# Patient Record
Sex: Male | Born: 1940
Health system: Southern US, Community
[De-identification: ages and names within clinical notes are randomized; demographics above are authoritative.]

## PROBLEM LIST (undated history)

## (undated) DIAGNOSIS — N400 Enlarged prostate without lower urinary tract symptoms: Secondary | ICD-10-CM

## (undated) HISTORY — DX: Benign prostatic hyperplasia without lower urinary tract symptoms: N40.0

---

## 2002-12-19 LAB — HM COLONOSCOPY

## 2008-04-05 ENCOUNTER — Encounter: Admission: RE | Admit: 2008-04-05 | Discharge: 2008-04-05 | Payer: Self-pay | Admitting: Family Medicine

## 2011-03-11 ENCOUNTER — Other Ambulatory Visit: Payer: Self-pay | Admitting: Family Medicine

## 2011-03-12 ENCOUNTER — Ambulatory Visit
Admission: RE | Admit: 2011-03-12 | Discharge: 2011-03-12 | Disposition: A | Discharge status: Home / Self Care | Payer: Medicare Other | Source: Ambulatory Visit | Attending: Family Medicine | Admitting: Family Medicine

## 2011-03-12 MED ORDER — IOHEXOL 300 MG/ML  SOLN
75.0000 mL | Freq: Once | INTRAMUSCULAR | Status: AC | PRN
  Administered 2011-03-12: 75 mL via INTRAVENOUS

## 2011-05-26 ENCOUNTER — Other Ambulatory Visit: Payer: Self-pay | Admitting: Family Medicine

## 2011-05-26 DIAGNOSIS — M898X2 Other specified disorders of bone, upper arm: Secondary | ICD-10-CM

## 2011-05-29 ENCOUNTER — Other Ambulatory Visit: Payer: Self-pay | Admitting: Family Medicine

## 2011-05-29 DIAGNOSIS — M898X2 Other specified disorders of bone, upper arm: Secondary | ICD-10-CM

## 2011-06-05 ENCOUNTER — Ambulatory Visit
Admission: RE | Admit: 2011-06-05 | Discharge: 2011-06-05 | Disposition: A | Discharge status: Home / Self Care | Payer: Medicare Other | Source: Ambulatory Visit | Attending: Family Medicine | Admitting: Family Medicine

## 2011-06-05 DIAGNOSIS — M898X2 Other specified disorders of bone, upper arm: Secondary | ICD-10-CM

## 2013-03-03 ENCOUNTER — Encounter: Payer: Self-pay | Admitting: Family Medicine

## 2013-03-03 ENCOUNTER — Ambulatory Visit (INDEPENDENT_AMBULATORY_CARE_PROVIDER_SITE_OTHER): Payer: Medicare Other | Admitting: Family Medicine

## 2013-03-03 VITALS — BP 120/82 | HR 68 | Temp 97.6°F | Resp 14 | Ht 74.5 in | Wt 239.0 lb

## 2013-03-03 DIAGNOSIS — Z23 Encounter for immunization: Secondary | ICD-10-CM

## 2013-03-03 DIAGNOSIS — L723 Sebaceous cyst: Secondary | ICD-10-CM

## 2013-03-03 DIAGNOSIS — B351 Tinea unguium: Secondary | ICD-10-CM

## 2013-03-03 MED ORDER — TERBINAFINE HCL 250 MG PO TABS: 250.0000 mg | ORAL_TABLET | Freq: Every day | ORAL | Status: DC

## 2013-03-03 NOTE — Progress Notes (Signed)
   Subjective:    Patient ID: Jose Owen, male    DOB: Mar 20, 1941, 72 y.o.   MRN: [id number]  HPI  Both great toenails have become thick and dystrophic over the last few months.  Now they have started to separate and lift from the underlying nail bed.  He requests evaluation.  He is also concerned about a cystic lesion on the posterior of his neck. No past medical history on file. No current outpatient prescriptions on file prior to visit.   No current facility-administered medications on file prior to visit.   No Known Allergies History   Social History  . Marital Status: Married    Spouse Name: N/A    Number of Children: N/A  . Years of Education: N/A   Occupational History  . Not on file.   Social History Main Topics  . Smoking status: Former Smoker    Types: Cigarettes  . Smokeless tobacco: Not on file  . Alcohol Use: No  . Drug Use: No  . Sexual Activity: Not on file   Other Topics Concern  . Not on file   Social History Narrative  . No narrative on file     Review of Systems  All other systems reviewed and are negative.       Objective:   Physical Exam  Vitals reviewed. Cardiovascular: Normal rate and regular rhythm.   No murmur heard. Pulmonary/Chest: Effort normal and breath sounds normal. No respiratory distress. He has no wheezes.  sebeceous cyst on back of his neck 2 cm in size.   Onychomycosis of both great toenails.         Assessment & Plan:  1. Onychomycosis Lamisil 250 mg poqday for 90 days  Recheck cmp monthly on meds to monitor for liver toxicity.  2. Sebaceous cyst Offered surgical excision but patient declined  Patient received prevnar 13 and flu shot.

## 2013-03-04 NOTE — Addendum Note (Signed)
Addended by: Legrand Rams B on: 03/04/2013 08:13 AM   Modules accepted: Orders

## 2013-03-22 ENCOUNTER — Ambulatory Visit (INDEPENDENT_AMBULATORY_CARE_PROVIDER_SITE_OTHER): Payer: Medicare Other | Admitting: Family Medicine

## 2013-03-22 ENCOUNTER — Encounter: Payer: Self-pay | Admitting: Family Medicine

## 2013-03-22 VITALS — BP 136/70 | HR 72 | Temp 98.7°F | Resp 16 | Wt 250.0 lb

## 2013-03-22 DIAGNOSIS — B029 Zoster without complications: Secondary | ICD-10-CM

## 2013-03-22 MED ORDER — VALACYCLOVIR HCL 1 G PO TABS: 1000.0000 mg | ORAL_TABLET | Freq: Three times a day (TID) | ORAL | Status: DC

## 2013-03-22 MED ORDER — HYDROCODONE-ACETAMINOPHEN 5-325 MG PO TABS: 1.0000 | ORAL_TABLET | Freq: Four times a day (QID) | ORAL | Status: DC | PRN

## 2013-03-22 NOTE — Progress Notes (Signed)
   Subjective:    Patient ID: Jose Owen, male    DOB: [birth year], 72 y.o.   MRN: [id number]  HPI Patient developed low back pain approximately 3 days ago. One day ago he developed a blistering erythematous rash that began at the level of L1 in the center of his back and radiated around his back and dermatomal pattern. It has a classic appearance of shingles. The area is extremely tender and sensitive.  He denies any fevers headaches or signs of systemic illness. No past medical history on file. Current Outpatient Prescriptions on File Prior to Visit  Medication Sig Dispense Refill  . glucosamine-chondroitin 500-400 MG tablet Take 1 tablet by mouth 3 (three) times daily.      Marland Kitchen terbinafine (LAMISIL) 250 MG tablet Take 1 tablet (250 mg total) by mouth daily.  30 tablet  2   No current facility-administered medications on file prior to visit.   No Known Allergies History   Social History  . Marital Status: Married    Spouse Name: N/A    Number of Children: N/A  . Years of Education: N/A   Occupational History  . Not on file.   Social History Main Topics  . Smoking status: Former Smoker    Types: Cigarettes  . Smokeless tobacco: Not on file  . Alcohol Use: No  . Drug Use: No  . Sexual Activity: Not on file   Other Topics Concern  . Not on file   Social History Narrative  . No narrative on file      Review of Systems  All other systems reviewed and are negative.       Objective:   Physical Exam  Vitals reviewed. Cardiovascular: Normal rate, regular rhythm and normal heart sounds.  Exam reveals no gallop.   No murmur heard. Pulmonary/Chest: Effort normal and breath sounds normal. No respiratory distress. He has no wheezes. He has no rales.  Abdominal: Soft. Bowel sounds are normal.  Skin: Rash noted. There is erythema.   shingles-like rash in a dermatomal pattern beginning around the level of L1 in the center of the back. The area is approximately 12 centimeters  by 4 cm in size.        Assessment & Plan:  1. Shingles I discussed the natural history of shingles. I explained that the rash is not contagious to people that had chickenpox. However I explained that it is contagious to the touch to people who have not had chickenpox. I recommended that the patient keep his distance from pregnant women and young infants. - valACYclovir (VALTREX) 1000 MG tablet; Take 1 tablet (1,000 mg total) by mouth 3 (three) times daily.  Dispense: 21 tablet; Refill: 0 - HYDROcodone-acetaminophen (NORCO) 5-325 MG per tablet; Take 1-2 tablets by mouth every 6 (six) hours as needed for moderate pain.  Dispense: 30 tablet; Refill: 0

## 2014-09-08 ENCOUNTER — Encounter: Payer: Self-pay | Admitting: Family Medicine

## 2015-04-12 ENCOUNTER — Ambulatory Visit (INDEPENDENT_AMBULATORY_CARE_PROVIDER_SITE_OTHER): Payer: Medicare Other | Admitting: Family Medicine

## 2015-04-12 ENCOUNTER — Encounter: Payer: Self-pay | Admitting: Family Medicine

## 2015-04-12 ENCOUNTER — Ambulatory Visit
Admission: RE | Admit: 2015-04-12 | Discharge: 2015-04-12 | Disposition: A | Discharge status: Home / Self Care | Payer: Medicare Other | Source: Ambulatory Visit | Attending: Family Medicine | Admitting: Family Medicine

## 2015-04-12 VITALS — BP 110/68 | HR 80 | Temp 98.3°F | Resp 16 | Ht 74.5 in | Wt 244.0 lb

## 2015-04-12 DIAGNOSIS — M7071 Other bursitis of hip, right hip: Secondary | ICD-10-CM

## 2015-04-12 DIAGNOSIS — M7631 Iliotibial band syndrome, right leg: Secondary | ICD-10-CM

## 2015-04-12 MED ORDER — DICLOFENAC SODIUM 75 MG PO TBEC: 75.0000 mg | DELAYED_RELEASE_TABLET | Freq: Two times a day (BID) | ORAL | Status: DC

## 2015-04-12 NOTE — Progress Notes (Signed)
   Subjective:    Patient ID: Jose ArbourDonald J Mawhinney, male    DOB: [birth year], 75 y.o.   MRN: [id number]  HPI   patient reports pain in his right hip. The pain is located over the greater trochanter. It hurts to lay on that hip at night. He is tender to palpation in that area. Pain is exacerbated by external rotation of the hip. He has no pain with internal or external rotation in the hip joint itself. The pain radiates down from the greater trochanter to the lateral aspect of the knee. He has tightness and pain in that area consistent with IT band syndrome. No past medical history on file. No past surgical history on file. Current Outpatient Prescriptions on File Prior to Visit  Medication Sig Dispense Refill  . glucosamine-chondroitin 500-400 MG tablet Take 1 tablet by mouth 3 (three) times daily.     No current facility-administered medications on file prior to visit.   No Known Allergies Social History   Social History  . Marital Status: Married    Spouse Name: N/A  . Number of Children: N/A  . Years of Education: N/A   Occupational History  . Not on file.   Social History Main Topics  . Smoking status: Former Smoker    Types: Cigarettes  . Smokeless tobacco: Not on file  . Alcohol Use: No  . Drug Use: No  . Sexual Activity: Not on file   Other Topics Concern  . Not on file   Social History Narrative     Review of Systems  All other systems reviewed and are negative.      Objective:   Physical Exam  Cardiovascular: Normal rate, regular rhythm and normal heart sounds.   Pulmonary/Chest: Effort normal and breath sounds normal.  Musculoskeletal:       Right hip: He exhibits tenderness and bony tenderness. He exhibits normal range of motion, normal strength and no crepitus.  Vitals reviewed.         Assessment & Plan:  Hip bursitis, right - Plan: DG HIP UNILAT WITH PELVIS 2-3 VIEWS RIGHT, diclofenac (VOLTAREN) 75 MG EC tablet  IT band syndrome, right - Plan:  Ambulatory referral to Physical Therapy   Begin diclofenac 75 mg by mouth twice a day. I will also obtain an x-ray of the hip to rule out any other pathology. I will refer the patient to physical therapy for IT band syndrome. If symptoms are not improving, consider a cortisone injection for bursitis.

## 2015-04-16 ENCOUNTER — Telehealth: Payer: Self-pay | Admitting: *Deleted

## 2015-04-16 DIAGNOSIS — M7071 Other bursitis of hip, right hip: Secondary | ICD-10-CM

## 2015-04-16 MED ORDER — DICLOFENAC SODIUM 75 MG PO TBEC: 75.0000 mg | DELAYED_RELEASE_TABLET | Freq: Two times a day (BID) | ORAL | Status: DC

## 2015-04-16 NOTE — Telephone Encounter (Signed)
Received call from patient.   States that pharmacy has not received prescription for Diclofenac.   Prescription resubmitted.

## 2015-04-20 ENCOUNTER — Telehealth: Payer: Self-pay | Admitting: Family Medicine

## 2015-04-20 NOTE — Telephone Encounter (Signed)
DAUGHTER IN LAW ALLISON Haro CALLING TO ASK SOME QUESTIONS ABOUT HIS MEDICATION  [phone]

## 2015-04-20 NOTE — Telephone Encounter (Signed)
Pt was wanting to know if he could take Aleve or ASA along with the diclofenac. Per [facility] can take Tylenol but not another NSAID. Pt aware.

## 2015-07-22 DIAGNOSIS — M25551 Pain in right hip: Secondary | ICD-10-CM | POA: Diagnosis not present

## 2015-07-22 DIAGNOSIS — M7061 Trochanteric bursitis, right hip: Secondary | ICD-10-CM | POA: Diagnosis not present

## 2016-02-14 ENCOUNTER — Ambulatory Visit (INDEPENDENT_AMBULATORY_CARE_PROVIDER_SITE_OTHER): Payer: Medicare Other | Admitting: Physician Assistant

## 2016-02-14 ENCOUNTER — Encounter: Payer: Self-pay | Admitting: Physician Assistant

## 2016-02-14 VITALS — BP 130/70 | HR 62 | Temp 97.7°F | Resp 16 | Wt 245.0 lb

## 2016-02-14 DIAGNOSIS — H8110 Benign paroxysmal vertigo, unspecified ear: Secondary | ICD-10-CM

## 2016-02-14 MED ORDER — MECLIZINE HCL 25 MG PO TABS: 25.0000 mg | ORAL_TABLET | Freq: Three times a day (TID) | ORAL | 0 refills | Status: DC | PRN

## 2016-02-14 NOTE — Progress Notes (Addendum)
    Patient ID: Jose ArbourDonald J Owen MRN: [id number], DOB: [birth year], 75 y.o. Date of Encounter: 02/14/2016, 11:01 AM    Chief Complaint:  Chief Complaint  Patient presents with  . Dizziness    x 3days, when waking up and moving head     HPI: 75 y.o. year old male presents with above.   He states that he has had this happen to him before but is been years ago. Says that when he moves his head he feels a chronic staggery sensation. Says that when he has this movement it doesn't really cause any true spinning. Says that this only occurs when he moves his head to a different position.  He has had no extremity weakness. No slurred speech. No facial drooping.  He has had no recent respiratory infection the last couple of weeks.     Home Meds:   Outpatient Medications Prior to Visit  Medication Sig Dispense Refill  . glucosamine-chondroitin 500-400 MG tablet Take 1 tablet by mouth 3 (three) times daily.    Marland Kitchen. acetaminophen (TYLENOL) 325 MG tablet Take 650 mg by mouth every 6 (six) hours as needed.    . diclofenac (VOLTAREN) 75 MG EC tablet Take 1 tablet (75 mg total) by mouth 2 (two) times daily. 60 tablet 0   No facility-administered medications prior to visit.     Allergies: No Known Allergies    Review of Systems: See HPI for pertinent ROS. All other ROS negative.    Physical Exam: Blood pressure 130/70, pulse 62, temperature 97.7 F (36.5 C), temperature source Oral, resp. rate 16, weight 245 lb (111.1 kg), SpO2 98 %., Body mass index is 31.04 kg/m. General:  WNWD WM. Appears in no acute distress. HEENT: Normocephalic, atraumatic, eyes without discharge, sclera non-icteric, nares are without discharge. Bilateral auditory canals clear, TM's are without perforation, pearly grey and translucent with reflective cone of light bilaterally. Oral cavity moist, posterior pharynx without exudate, erythema, peritonsillar abscess.  Neck: Supple. No thyromegaly. No lymphadenopathy. Lungs:  Clear bilaterally to auscultation without wheezes, rales, or rhonchi. Breathing is unlabored. Heart: Regular rhythm. No murmurs, rubs, or gallops. Msk:  Strength and tone normal for age. [employer] Maneuver: This does elicit symptoms. Says that the ceiling tiles are not actually spinning but do look like they are moving when he does this. Extremities/Skin: Warm and dry. Neuro: Alert and oriented X 3. Moves all extremities spontaneously. Gait is normal. CNII-XII grossly in tact. Romberg normal. 5/5 bilateral upper extremities and bilateral lower extremities and bilateral grip strength. Psych:  Responds to questions appropriately with a normal affect.     ASSESSMENT AND PLAN:  75 y.o. year old male with  1. Benign paroxysmal positional vertigo, unspecified laterality He is to use the meclizine as needed for symptom relief. If symptoms do not resolve within 1 week he is to follow-up. As well if he were to develop additional symptoms or symptoms worsen significantly then follow-up in the interim. - meclizine (ANTIVERT) 25 MG tablet; Take 1 tablet (25 mg total) by mouth 3 (three) times daily as needed for dizziness.  Dispense: 30 tablet; Refill: 0   Signed, [address] WilmingtonDixon, [place], [facility] 02/14/2016 11:01 AM

## 2016-02-18 ENCOUNTER — Telehealth: Payer: Self-pay | Admitting: Family Medicine

## 2016-02-18 NOTE — Telephone Encounter (Signed)
Spoke with pt he stated he will give it a couple more days and see how he is feeling

## 2016-02-18 NOTE — Telephone Encounter (Signed)
Continue to use the meclizine as needed for now. If symptoms worsen then call us. Otherwise just continue the meclizine as needed through the holiday and then if symptoms do not resolve after that-- then definitely follow-up with us.

## 2016-02-18 NOTE — Telephone Encounter (Signed)
Patient was here last week to see mb dixon, is not better from last visit would like a call back to advise what he should do  [phone]

## 2016-02-18 NOTE — Telephone Encounter (Signed)
I spoke with Jose Owen he states he feels much better since his last visit. In the mornings he is still very dizzy, he is however taking the meclizine as instructed. It has not been a week since last visit and because of the holiday, what do you suggest he do?

## 2016-12-04 ENCOUNTER — Telehealth: Payer: Self-pay | Admitting: Family Medicine

## 2016-12-04 NOTE — Telephone Encounter (Signed)
New Message  Pt voiced he wanted to make us away when he blew his nose he had a blood clot and not sure if it is something he needs to be alarmed.  Please f/u

## 2016-12-05 ENCOUNTER — Ambulatory Visit (INDEPENDENT_AMBULATORY_CARE_PROVIDER_SITE_OTHER): Payer: Medicare Other | Admitting: Family Medicine

## 2016-12-05 ENCOUNTER — Encounter: Payer: Self-pay | Admitting: Family Medicine

## 2016-12-05 VITALS — BP 126/74 | HR 96 | Temp 98.2°F | Resp 14 | Ht 74.0 in | Wt 247.4 lb

## 2016-12-05 DIAGNOSIS — J0100 Acute maxillary sinusitis, unspecified: Secondary | ICD-10-CM

## 2016-12-05 DIAGNOSIS — R5383 Other fatigue: Secondary | ICD-10-CM

## 2016-12-05 MED ORDER — AMOXICILLIN 875 MG PO TABS: 875.0000 mg | ORAL_TABLET | Freq: Two times a day (BID) | ORAL | 0 refills | Status: DC

## 2016-12-05 NOTE — Progress Notes (Signed)
   Subjective:    Patient ID: Jose ArbourDonald J Owen, male    DOB: [birth year], 76 y.o.   MRN: [id number]  Patient presents for chest congestion (x3days ); blowing blood clots (out of nose); and Fatigue (x356months)   Mo[place]ay after cleaning out the shed, Right sided headache snuses, had blood from nose with blowing yesterday, fatigue, no fever  Used Claritin D, He's had some mild cough but is mostly in the sinus area.  Feeling fatigued for past 6 months     - wife is disabled he does all housework, Cleaning cooking he's not sure if this is more his age IS something else going on. He is still able to do the activities just feels exhausted.    Review Of Systems:  GEN- + fatigue, fever, weight loss,weakness, recent illness HEENT- denies eye drainage, change in vision, nasal discharge, CVS- denies chest pain, palpitations RESP- denies SOB, +cough, wheeze ABD- denies N/V, change in stools, abd pain GU- denies dysuria, hematuria, dribbling, incontinence MSK- denies joint pain, muscle aches, injury Neuro- denies headache, dizziness, syncope, seizure activity       Objective:    BP 126/74   Pulse 96   Temp 98.2 F (36.8 C) (Oral)   Resp 14   Ht 6\' 2"  (1.88 m)   Wt 247 lb 6.4 oz (112.2 kg)   SpO2 99%   BMI 31.76 kg/m  GEN- NAD, alert and oriented x3 HEENT- PERRL, EOMI, non injected sclera, pink conjunctiva, MMM, oropharynxclear , TM clear bilat no effusion,  + mild maxillary sinus tenderness, inflammed turbinates,  Nasal drainage  Neck- Supple, no LAD, no thyromegaly  CVS- RRR, no murmur RESP-CTAB EXT- No edema Pulses- Radial 2+         Assessment & Plan:      Problem List Items Addressed This Visit      Unprioritized   Fatigue   Relevant Orders   CBC with Differential/Platelet   Comprehensive metabolic panel   TSH   Vitamin B12    Other Visit Diagnoses    Acute maxillary sinusitis, recurrence not specified    -  Primary   Amox 875mg  BID, flonase/nasal saline. Robitussn  for cough   Relevant Medications   amoxicillin (AMOXIL) 875 MG tablet      Note: This dictation was prepared with Dragon dictation along with smaller phrase technology. Any transcriptional errors that result from this process are unintentional.

## 2016-12-05 NOTE — Patient Instructions (Signed)
Nasal saline Use robitussin DM Take antibiotics We will call with lab results  F/U as needed

## 2016-12-05 NOTE — Telephone Encounter (Signed)
Called patient back he was out in shed. Patient has an appointment scheduled for today

## 2016-12-06 LAB — CBC WITH DIFFERENTIAL/PLATELET
BASOS ABS: 42 {cells}/uL (ref 0–200)
Basophils Relative: 0.9 %
EOS ABS: 212 {cells}/uL (ref 15–500)
EOS PCT: 4.5 %
HEMATOCRIT: 40.1 % (ref 38.5–50.0)
HEMOGLOBIN: 13.9 g/dL (ref 13.2–17.1)
LYMPHS ABS: 1034 {cells}/uL (ref 850–3900)
MCH: 31.7 pg (ref 27.0–33.0)
MCHC: 34.7 g/dL (ref 32.0–36.0)
MCV: 91.3 fL (ref 80.0–100.0)
MPV: 11.2 fL (ref 7.5–12.5)
Monocytes Relative: 10.7 %
NEUTROS ABS: 2909 {cells}/uL (ref 1500–7800)
Neutrophils Relative %: 61.9 %
Platelets: 191 10*3/uL (ref 140–400)
RBC: 4.39 10*6/uL (ref 4.20–5.80)
RDW: 12.8 % (ref 11.0–15.0)
Total Lymphocyte: 22 %
WBC mixed population: 503 cells/uL (ref 200–950)
WBC: 4.7 10*3/uL (ref 3.8–10.8)

## 2016-12-06 LAB — COMPREHENSIVE METABOLIC PANEL
AG RATIO: 1.8 (calc) (ref 1.0–2.5)
ALT: 13 U/L (ref 9–46)
AST: 17 U/L (ref 10–35)
Albumin: 3.9 g/dL (ref 3.6–5.1)
Alkaline phosphatase (APISO): 72 U/L (ref 40–115)
BILIRUBIN TOTAL: 0.7 mg/dL (ref 0.2–1.2)
BUN: 15 mg/dL (ref 7–25)
CALCIUM: 9.1 mg/dL (ref 8.6–10.3)
CHLORIDE: 106 mmol/L (ref 98–110)
CO2: 25 mmol/L (ref 20–32)
Creat: 0.94 mg/dL (ref 0.70–1.18)
GLOBULIN: 2.2 g/dL (ref 1.9–3.7)
Glucose, Bld: 129 mg/dL — ABNORMAL HIGH (ref 65–99)
POTASSIUM: 4 mmol/L (ref 3.5–5.3)
SODIUM: 140 mmol/L (ref 135–146)
TOTAL PROTEIN: 6.1 g/dL (ref 6.1–8.1)

## 2016-12-06 LAB — VITAMIN B12: Vitamin B-12: 301 pg/mL (ref 200–1100)

## 2016-12-06 LAB — TSH: TSH: 1.75 mIU/L (ref 0.40–4.50)

## 2016-12-08 ENCOUNTER — Telehealth: Payer: Self-pay

## 2016-12-08 MED ORDER — VITAMIN B-12 1000 MCG PO TABS: 1000.0000 ug | ORAL_TABLET | Freq: Every day | ORAL | Status: AC

## 2016-12-08 NOTE — Telephone Encounter (Signed)
-----   Message from Salley ScarletKawanta F [place], MD sent at 12/07/2016  8:12 PM EDT ----- Labs overall look good, B12 is low normal side, so he Fye benefit from B12 1000mcg OTC daily for energy

## 2016-12-08 NOTE — Telephone Encounter (Signed)
Patient is aware to start b12 

## 2017-01-12 DIAGNOSIS — Z0000 Encounter for general adult medical examination without abnormal findings: Secondary | ICD-10-CM | POA: Diagnosis not present

## 2017-01-12 DIAGNOSIS — Z1211 Encounter for screening for malignant neoplasm of colon: Secondary | ICD-10-CM | POA: Diagnosis not present

## 2017-01-23 ENCOUNTER — Ambulatory Visit (INDEPENDENT_AMBULATORY_CARE_PROVIDER_SITE_OTHER): Payer: Medicare Other | Admitting: Family Medicine

## 2017-01-23 ENCOUNTER — Encounter: Payer: Self-pay | Admitting: Family Medicine

## 2017-01-23 VITALS — BP 122/72 | HR 95 | Temp 98.2°F | Resp 16 | Ht 74.5 in | Wt 252.0 lb

## 2017-01-23 DIAGNOSIS — J40 Bronchitis, not specified as acute or chronic: Secondary | ICD-10-CM | POA: Diagnosis not present

## 2017-01-23 MED ORDER — HYDROCODONE-HOMATROPINE 5-1.5 MG/5ML PO SYRP: 5.0000 mL | ORAL_SOLUTION | Freq: Three times a day (TID) | ORAL | 0 refills | Status: DC | PRN

## 2017-01-23 MED ORDER — LEVOFLOXACIN 500 MG PO TABS: 500.0000 mg | ORAL_TABLET | Freq: Every day | ORAL | 0 refills | Status: DC

## 2017-01-23 NOTE — Progress Notes (Signed)
   Subjective:    Patient ID: Jose ArbourDonald J Canizales, male    DOB: 10-02-1940, 76 y.o.   MRN: [id number]  HPI Patient's symptoms began more than a week ago.  Started as an upper respiratory infection/cold but his symptoms progressed into his chest.  He now has expiratory wheezing, rhonchorous breath sounds, increasing shortness of breath, chest congestion but a nonproductive cough.  He also reports of subjective fevers and worsening fatigue.  In addition to this, he is also had worsening sinus pain and sinus pressure now for more than 7 days in both frontal and maxillary sinuses.  He has tried supportive care including Alka seltzer plus, Mucinex, Robitussin over-the-counter without any benefit No past medical history on file. No past surgical history on file. Current Outpatient Prescriptions on File Prior to Visit  Medication Sig Dispense Refill  . vitamin B-12 (CYANOCOBALAMIN) 1000 MCG tablet Take 1 tablet (1,000 mcg total) by mouth daily.     No current facility-administered medications on file prior to visit.    No Known Allergies Social History   Social History  . Marital status: Married    Spouse name: N/A  . Number of children: N/A  . Years of education: N/A   Occupational History  . Not on file.   Social History Main Topics  . Smoking status: Former Smoker    Types: Cigarettes  . Smokeless tobacco: Never Used  . Alcohol use No  . Drug use: No  . Sexual activity: Not on file   Other Topics Concern  . Not on file   Social History Narrative  . No narrative on file     Review of Systems  All other systems reviewed and are negative.      Objective:   Physical Exam  Constitutional: He appears well-developed and well-nourished. No distress.  HENT:  Head: Normocephalic.  Right Ear: External ear normal.  Left Ear: External ear normal.  Nose: Mucosal edema and rhinorrhea present. Right sinus exhibits frontal sinus tenderness. Left sinus exhibits frontal sinus tenderness.    Mouth/Throat: Oropharynx is clear and moist. No oropharyngeal exudate.  Neck: Neck supple.  Cardiovascular: Normal rate, regular rhythm and normal heart sounds.   Pulmonary/Chest: Effort normal. He has wheezes. He has rales.  Lymphadenopathy:    He has no cervical adenopathy.  Skin: He is not diaphoretic.  Vitals reviewed.         Assessment & Plan:  Bronchitis  Symptoms originally sounded viral, however now I believe he is progressed to a bacterial bronchitis versus walking pneumonia.  He clinically appears ill.  Therefore I will start the patient on Levaquin 500 mg daily for 7 days in addition to Hycodan 1 teaspoon every 6-8 hours as needed.  Continue Mucinex

## 2017-03-05 ENCOUNTER — Encounter: Payer: Self-pay | Admitting: Physician Assistant

## 2017-03-05 ENCOUNTER — Other Ambulatory Visit: Payer: Self-pay

## 2017-03-05 ENCOUNTER — Ambulatory Visit: Payer: Medicare Other | Admitting: Physician Assistant

## 2017-03-05 VITALS — BP 126/80 | HR 84 | Temp 98.1°F | Resp 16 | Wt 252.8 lb

## 2017-03-05 DIAGNOSIS — J988 Other specified respiratory disorders: Secondary | ICD-10-CM | POA: Diagnosis not present

## 2017-03-05 MED ORDER — AZITHROMYCIN 250 MG PO TABS: ORAL_TABLET | ORAL | 0 refills | Status: DC

## 2017-03-05 NOTE — Progress Notes (Signed)
Patient ID: Jose ArbourDonald J Owen MRN: [id number], DOB: 1940-06-04, 76 y.o. Date of Encounter: 03/05/2017, 12:02 PM    Chief Complaint:  Chief Complaint  Patient presents with  . Cough    x869month      HPI: 76 y.o. year old male presents with above.   Noted that the staff documented above that cough has been going on a month.  Reviewed Dr. Caren MacadamPickard's note from 01/23/17. At that visit patient presented with chest congestion and cough as well as sinus congestion sinus pressure.  At that visit he prescribed Levaquin.  Today I discussed with patient that I have reviewed Dr. Caren MacadamPickard's last note and was concerned of whether patient's symptoms had persisted ever since then or whether these were new symptoms.  Patient reports that he took the Levaquin as directed.  Reports that symptoms had resolved. Reports that symptoms stayed resolved until this past weekend.  Says he "was feeling fine until this past weekend".   At that time, started coughing a little bit "but not real bad like I was when I came for the last visit ".  Says that one night this week he had some cough and felt little short of breath with that cough.   Reports that yesterday morning when he woke up he felt perfectly fine.  Did not feel any sinus congestion, his throat was not sore, he had no cough or chest congestion.   However, later in the day yesterday had some cough.  Says that at times he feels like there is something stuck--phlegm stuck that he cannot get out. Says that he did not want this to get bad over the weekend when he could not come in.     Home Meds:   Outpatient Medications Prior to Visit  Medication Sig Dispense Refill  . HYDROcodone-homatropine (HYCODAN) 5-1.5 MG/5ML syrup Take 5 mLs by mouth every 8 (eight) hours as needed for cough. 120 mL 0  . vitamin B-12 (CYANOCOBALAMIN) 1000 MCG tablet Take 1 tablet (1,000 mcg total) by mouth daily.    Marland Kitchen. levofloxacin (LEVAQUIN) 500 MG tablet Take 1 tablet (500 mg total)  by mouth daily. 7 tablet 0   No facility-administered medications prior to visit.     Allergies: No Known Allergies    Review of Systems: See HPI for pertinent ROS. All other ROS negative.    Physical Exam: Blood pressure 126/80, pulse 84, temperature 98.1 F (36.7 C), temperature source Oral, resp. rate 16, weight 114.7 kg (252 lb 12.8 oz), SpO2 96 %., Body mass index is 32.02 kg/m. General: WNWD WM.  Appears in no acute distress. HEENT: Normocephalic, atraumatic, eyes without discharge, sclera non-icteric, nares are without discharge. Bilateral auditory canals clear, TM's are without perforation, pearly grey and translucent with reflective cone of light bilaterally. Oral cavity moist, posterior pharynx without exudate, erythema.  No tenderness with percussion to frontal or maxillary sinuses.  Neck: Supple. No thyromegaly. No lymphadenopathy. Lungs: Clear bilaterally to auscultation without wheezes, rales, or rhonchi. Breathing is unlabored. Heart: Regular rhythm. No murmurs, rubs, or gallops. Msk:  Strength and tone normal for age. Extremities/Skin: Warm and dry.  Neuro: Alert and oriented X 3. Moves all extremities spontaneously. Gait is normal. CNII-XII grossly in tact. Psych:  Responds to questions appropriately with a normal affect.     ASSESSMENT AND PLAN:  76 y.o. year old male with  1. Respiratory infection I discussed that this is most likely a viral illness.  Recommend he take Mucinex DM as  an expectorant.  He is to take the antibiotic only if symptoms worsen significantly or develops fever or if symptoms persist greater than 7-10 days.  Voices understanding and agrees. - azithromycin (ZITHROMAX) 250 MG tablet; Day 1: Take 2 daily. Days 2 -5: Take 1 daily.  Dispense: 6 tablet; Refill: 0   Signed, [address] HattonDixon, [place], [facility] 03/05/2017 12:02 PM

## 2018-04-30 DIAGNOSIS — Z0000 Encounter for general adult medical examination without abnormal findings: Secondary | ICD-10-CM | POA: Diagnosis not present

## 2019-05-10 DIAGNOSIS — H5203 Hypermetropia, bilateral: Secondary | ICD-10-CM | POA: Diagnosis not present

## 2019-05-10 DIAGNOSIS — H2513 Age-related nuclear cataract, bilateral: Secondary | ICD-10-CM | POA: Diagnosis not present

## 2019-05-10 DIAGNOSIS — H43393 Other vitreous opacities, bilateral: Secondary | ICD-10-CM | POA: Diagnosis not present

## 2019-05-10 DIAGNOSIS — H43813 Vitreous degeneration, bilateral: Secondary | ICD-10-CM | POA: Diagnosis not present

## 2019-07-12 ENCOUNTER — Encounter: Payer: Self-pay | Admitting: Nurse Practitioner

## 2019-07-12 ENCOUNTER — Ambulatory Visit (INDEPENDENT_AMBULATORY_CARE_PROVIDER_SITE_OTHER): Payer: Medicare Other | Admitting: Nurse Practitioner

## 2019-07-12 ENCOUNTER — Ambulatory Visit
Admission: RE | Admit: 2019-07-12 | Discharge: 2019-07-12 | Disposition: A | Discharge status: Home / Self Care | Payer: Medicare Other | Source: Ambulatory Visit | Attending: Nurse Practitioner | Admitting: Nurse Practitioner

## 2019-07-12 ENCOUNTER — Other Ambulatory Visit: Payer: Self-pay

## 2019-07-12 VITALS — BP 130/84 | HR 67 | Temp 97.6°F | Resp 18 | Wt 250.0 lb

## 2019-07-12 DIAGNOSIS — M5416 Radiculopathy, lumbar region: Secondary | ICD-10-CM

## 2019-07-12 DIAGNOSIS — W19XXXA Unspecified fall, initial encounter: Secondary | ICD-10-CM

## 2019-07-12 DIAGNOSIS — M545 Low back pain: Secondary | ICD-10-CM | POA: Diagnosis not present

## 2019-07-12 MED ORDER — PREDNISONE 10 MG PO TABS: ORAL_TABLET | ORAL | 0 refills | Status: DC

## 2019-07-12 NOTE — Progress Notes (Signed)
Established Patient Office Visit  Subjective:  Patient ID: Oniel Meleski Nease, male    DOB: 1941/01/15  Age: 79 y.o. MRN: [id number]  CC:  Chief Complaint  Patient presents with  . Back Pain    feels like sciatica nerve, pain radiates down R leg, started x1 month, fell in [place] can be cause to injury, ibuprofen and aleve was taken    HPI Ebert Forrester Myung is a 79 year old male presenting to the clinic for c/o lumbar pain, right with sciatica. The pt fell 2 weeks ago tripping while building a fire pit with his son, no medical evaluation or tx done. He noticed pain the next day that progressed over the past two weeks only when twisting leg or sitting to standing that radiated to right leg.  Will do Xray to evaluate acute injury and initiate antiinflammatory.   History reviewed. No pertinent past medical history.  History reviewed. No pertinent surgical history.  History reviewed. No pertinent family history.  Social History   Socioeconomic History  . Marital status: Married    Spouse name: Not on file  . Number of children: Not on file  . Years of education: Not on file  . Highest education level: Not on file  Occupational History  . Not on file  Tobacco Use  . Smoking status: Former Smoker    Types: Cigarettes  . Smokeless tobacco: Never Used  Substance and Sexual Activity  . Alcohol use: No  . Drug use: No  . Sexual activity: Not on file  Other Topics Concern  . Not on file  Social History Narrative  . Not on file   Social Determinants of Health   Financial Resource Strain:   . Difficulty of Paying Living Expenses:   Food Insecurity:   . Worried About Charity fundraiser in the Last Year:   . Arboriculturist in the Last Year:   Transportation Needs:   . Film/video editor (Medical):   Marland Kitchen Lack of Transportation (Non-Medical):   Physical Activity:   . Days of Exercise per Week:   . Minutes of Exercise per Session:   Stress:   . Feeling of Stress :   Social  Connections:   . Frequency of Communication with Friends and Family:   . Frequency of Social Gatherings with Friends and Family:   . Attends Religious Services:   . Active Member of Clubs or Organizations:   . Attends Archivist Meetings:   Marland Kitchen Marital Status:   Intimate Partner Violence:   . Fear of Current or Ex-Partner:   . Emotionally Abused:   Marland Kitchen Physically Abused:   . Sexually Abused:     Outpatient Medications Prior to Visit  Medication Sig Dispense Refill  . vitamin B-12 (CYANOCOBALAMIN) 1000 MCG tablet Take 1 tablet (1,000 mcg total) by mouth daily.    Marland Kitchen azithromycin (ZITHROMAX) 250 MG tablet Day 1: Take 2 daily. Days 2 -5: Take 1 daily. 6 tablet 0  . HYDROcodone-homatropine (HYCODAN) 5-1.5 MG/5ML syrup Take 5 mLs by mouth every 8 (eight) hours as needed for cough. 120 mL 0   No facility-administered medications prior to visit.    No Known Allergies  ROS Review of Systems  All other systems reviewed and are negative.     Objective:    Physical Exam  Constitutional: He is oriented to person, place, and time. He appears well-developed and well-nourished.  HENT:  Head: Normocephalic.  Eyes: Pupils are equal, round,  and reactive to light. EOM are normal.  Cardiovascular: Normal rate.  Pulmonary/Chest: Effort normal.  Abdominal: Soft.  Musculoskeletal:        General: No tenderness, deformity or edema. Normal range of motion.     Cervical back: Normal range of motion and neck supple.     Lumbar back: Pain present. No swelling, edema, deformity, lacerations, spasms, tenderness or bony tenderness. Normal range of motion. Normal pulse.  Neurological: He is oriented to person, place, and time.  Skin: Skin is warm and dry.  Psychiatric: He has a normal mood and affect. His behavior is normal. Thought content normal.  Nursing note and vitals reviewed.   BP 130/84 (BP Location: Left Arm, Patient Position: Sitting, Cuff Size: Large)   Pulse 67   Temp 97.6 F  (36.4 C) (Temporal)   Resp 18   Wt 250 lb (113.4 kg)   SpO2 95%   BMI 31.67 kg/m  Wt Readings from Last 3 Encounters:  07/12/19 250 lb (113.4 kg)  03/05/17 252 lb 12.8 oz (114.7 kg)  01/23/17 252 lb (114.3 kg)     Health Maintenance Due  Topic Date Due  . TETANUS/TDAP  Never done  . PNA vac Low Risk Adult (2 of 2 - PPSV23) 03/03/2014    There are no preventive care reminders to display for this patient.  Lab Results  Component Value Date   TSH 1.75 12/05/2016   Lab Results  Component Value Date   WBC 4.7 12/05/2016   HGB 13.9 12/05/2016   HCT 40.1 12/05/2016   MCV 91.3 12/05/2016   PLT 191 12/05/2016   Lab Results  Component Value Date   NA 140 12/05/2016   K 4.0 12/05/2016   CO2 25 12/05/2016   GLUCOSE 129 (H) 12/05/2016   BUN 15 12/05/2016   CREATININE 0.94 12/05/2016   BILITOT 0.7 12/05/2016   AST 17 12/05/2016   ALT 13 12/05/2016   PROT 6.1 12/05/2016   CALCIUM 9.1 12/05/2016     Assessment & Plan:   Problem List Items Addressed This Visit    None    Visit Diagnoses    Fall, initial encounter    -  Primary   Relevant Orders   DG Lumbar Spine Complete   Right lumbar radiculopathy       Relevant Orders   DG Lumbar Spine Complete     Take prednisone as prescribed.  Complete xray today to evaluate acute injury from fall. [place] take over the counter pain relief medications as directed. Rest, Ice, Follow up for worsening or non resolving symptoms.  Meds ordered this encounter  Medications  . predniSONE (DELTASONE) 10 MG tablet    Sig: Take 2 tabs day one, two, and three Take 1 tab day four and five    Dispense:  8 tablet    Refill:  0    Follow-up: Return if symptoms worsen or fail to improve.    Elmore Guise, FNP

## 2019-07-12 NOTE — Patient Instructions (Addendum)
Take prednisone as prescribed.  Complete xray today to evaluate acute injury from fall. [place] take over the counter pain relief medications as directed. Rest, Ice, Follow up for worsening or non resolving symptoms.

## 2019-07-13 ENCOUNTER — Other Ambulatory Visit: Payer: Self-pay | Admitting: Nurse Practitioner

## 2019-07-13 ENCOUNTER — Telehealth: Payer: Self-pay

## 2019-07-13 DIAGNOSIS — M5386 Other specified dorsopathies, lumbar region: Secondary | ICD-10-CM

## 2019-07-13 NOTE — Telephone Encounter (Signed)
Error

## 2019-07-13 NOTE — Progress Notes (Signed)
IMPRESSION: 1. Grade 1 degenerative anterolisthesis at L5-S1. 2. Advanced facet hypertrophy at L4-5 and L5-S1. 3. Large lateral syndesmophytes along the right at L1-2 and L2-3 and on the left at L3-4.  I will refer to orthopedics, he wants a different ortho than he has had.

## 2019-07-18 ENCOUNTER — Encounter: Payer: Self-pay | Admitting: Family Medicine

## 2019-07-18 ENCOUNTER — Ambulatory Visit: Payer: Medicare Other | Admitting: Family Medicine

## 2019-07-18 ENCOUNTER — Other Ambulatory Visit: Payer: Self-pay

## 2019-07-18 DIAGNOSIS — M5441 Lumbago with sciatica, right side: Secondary | ICD-10-CM

## 2019-07-18 DIAGNOSIS — M5442 Lumbago with sciatica, left side: Secondary | ICD-10-CM | POA: Diagnosis not present

## 2019-07-18 MED ORDER — MELOXICAM 15 MG PO TABS: 7.5000 mg | ORAL_TABLET | Freq: Every day | ORAL | 6 refills | Status: DC | PRN

## 2019-07-18 NOTE — Progress Notes (Signed)
Office Visit Note   Patient: Jose Owen           Date of Birth: Apr 10, 1940           MRN: [id number] Visit Date: 07/18/2019 Requested by: Elmore Guise, FNP [address] [place],  [place] [address] PCP: Donita Brooks, MD  Subjective: Chief Complaint  Patient presents with  . Lower Back - Pain    Pain in the lower back and radiating pain down the legs, right more than left. No numbness/tingling. S/p a fall while in [place]. Had xrays of Lsp on 07/12/19.    HPI: He is here with right greater than left low back and hip pain.  About 3 weeks ago while in [place] he was helping his son repair a fire pit.  He tripped and fell and injured his right wrist.  He did not notice any back or hip pain at that time.  A week later he started feeling intermittent pain, right side greater than left, and the posterior lateral hips especially when walking and pivoting.  Today he is feeling much better than he was yesterday.  His PCP gave him prednisone which he has finished.  He had x-rays of his lumbar spine which I reviewed today.  He has never had problems with his low back but he does note that he has had similar pain in the lateral hips in the past.  Denies any bowel or bladder dysfunction, fevers or chills.  Denies any numbness in his legs.  He has also been chopping some trees which have fallen on his property.  He thinks this might have contributed to his pain.               ROS:   All other systems were reviewed and are negative.  Objective: Vital Signs: There were no vitals taken for this visit.  Physical Exam:  General:  Alert and oriented, in no acute distress. Pulm:  Breathing unlabored. Psy:  Normal mood, congruent affect.  Low back: He has no significant lumbar tenderness, no pain over the SI joints or in the sciatic notch.  Straight leg raise is negative, lower extremity strength and reflexes are normal. Right hip: I am not able to reproduce pain by palpation today.  He has good  range of motion of both hips with no pain on passive internal rotation.  When he walks, he is unable to recreate the pain today.  He states that he is much better, as previously mentioned.  Imaging: None today but recent x-rays were reviewed showing large lateral osteophytes on the right at L1-2 and L5-3, and on the left at L3-4.  Assessment & Plan: 1.  Improving right greater than left posterior lateral hip and leg pain -Discussed options with him and elected to try some home stretching and strengthening exercises.  I also gave him meloxicam to take as needed. -If symptoms worsen, he will contact me and I will order physical therapy.  If still no relief, then possibly additional imaging with lumbar MRI scan.     Procedures: No procedures performed  No notes on file     PMFS History: Patient Active Problem List   Diagnosis Date Noted  . Fatigue 12/05/2016   History reviewed. No pertinent past medical history.  History reviewed. No pertinent family history.  History reviewed. No pertinent surgical history. Social History   Occupational History  . Not on file  Tobacco Use  . Smoking status: Former  Smoker    Types: Cigarettes  . Smokeless tobacco: Never Used  Substance and Sexual Activity  . Alcohol use: No  . Drug use: No  . Sexual activity: Not on file

## 2019-07-22 ENCOUNTER — Ambulatory Visit (INDEPENDENT_AMBULATORY_CARE_PROVIDER_SITE_OTHER): Payer: Medicare Other | Admitting: Family Medicine

## 2019-07-22 ENCOUNTER — Other Ambulatory Visit: Payer: Self-pay

## 2019-07-22 ENCOUNTER — Encounter: Payer: Self-pay | Admitting: Family Medicine

## 2019-07-22 VITALS — BP 130/72 | HR 64 | Temp 97.9°F | Resp 16 | Ht 74.5 in | Wt 250.0 lb

## 2019-07-22 DIAGNOSIS — R5382 Chronic fatigue, unspecified: Secondary | ICD-10-CM

## 2019-07-22 DIAGNOSIS — Z23 Encounter for immunization: Secondary | ICD-10-CM | POA: Diagnosis not present

## 2019-07-22 DIAGNOSIS — Z125 Encounter for screening for malignant neoplasm of prostate: Secondary | ICD-10-CM

## 2019-07-22 DIAGNOSIS — Z13220 Encounter for screening for lipoid disorders: Secondary | ICD-10-CM

## 2019-07-22 DIAGNOSIS — Z0001 Encounter for general adult medical examination with abnormal findings: Secondary | ICD-10-CM | POA: Diagnosis not present

## 2019-07-22 NOTE — Progress Notes (Signed)
Subjective:    Patient ID: Jose Owen, male    DOB: 07-19-1940, 79 y.o.   MRN: [id number]  HPI  Patient is here today for complete physical exam.  However he has several issues that he wants to discuss.  He is complaining of low back pain radiating down his right leg.  X-rays of the lumbar spine revealed degenerative disc disease throughout.  Patient recently saw orthopedist who recommended starting meloxicam for sciatica and degenerative disc disease.  He continues to have pain radiating down his right leg particularly at night when he is lying in bed.  He describes it as a deep aching pain in his posterior thigh and posterior calf.  He gets better if he is walking on it and worse if he sitting or laying down.  He denies any saddle anesthesia or bowel or bladder incontinence.  He denies any depression although he did recently lose his wife.  He denies any memory loss.  He denies any falls.  He is due for prostate cancer screening.  However he also discusses feeling extremely tired.  He states that he has no energy.  He has been told in the past that he has low testosterone.  He denies any chest pain shortness of breath or dyspnea on exertion.  However on his exam, his uvula is extremely large.  His airway is almost totally obstructed by his uvula and his tongue.  I am unable to examine his posterior oropharynx.  I believe that there is no way he cannot have sleep apnea based on his clinical appearance and his body habitus.  He does report hypersomnolence. No past medical history on file. No past surgical history on file. Current Outpatient Medications on File Prior to Visit  Medication Sig Dispense Refill  . ibuprofen (ADVIL) 200 MG tablet Take 200 mg by mouth every 6 (six) hours as needed.    . meloxicam (MOBIC) 15 MG tablet Take 0.5-1 tablets (7.5-15 mg total) by mouth daily as needed for pain. 30 tablet 6  . vitamin B-12 (CYANOCOBALAMIN) 1000 MCG tablet Take 1 tablet (1,000 mcg total) by mouth  daily. (Patient not taking: Reported on 07/22/2019)     No current facility-administered medications on file prior to visit.   No Known Allergies Social History   Socioeconomic History  . Marital status: Married    Spouse name: Not on file  . Number of children: Not on file  . Years of education: Not on file  . Highest education level: Not on file  Occupational History  . Not on file  Tobacco Use  . Smoking status: Former Smoker    Types: Cigarettes  . Smokeless tobacco: Never Used  Substance and Sexual Activity  . Alcohol use: No  . Drug use: No  . Sexual activity: Not on file  Other Topics Concern  . Not on file  Social History Narrative  . Not on file   Social Determinants of Health   Financial Resource Strain:   . Difficulty of Paying Living Expenses:   Food Insecurity:   . Worried About Programme researcher, broadcasting/film/video in the Last Year:   . Barista in the Last Year:   Transportation Needs:   . Freight forwarder (Medical):   Marland Kitchen Lack of Transportation (Non-Medical):   Physical Activity:   . Days of Exercise per Week:   . Minutes of Exercise per Session:   Stress:   . Feeling of Stress :   Social Connections:   .  Frequency of Communication with Friends and Family:   . Frequency of Social Gatherings with Friends and Family:   . Attends Religious Services:   . Active Member of Clubs or Organizations:   . Attends Archivist Meetings:   Marland Kitchen Marital Status:   Intimate Partner Violence:   . Fear of Current or Ex-Partner:   . Emotionally Abused:   Marland Kitchen Physically Abused:   . Sexually Abused:    No family history on file. Father has a history of atrial fibrillation and prostate cancer.  Mother died due to old age.  He does have a family history of familial lipomatosis  Review of Systems  All other systems reviewed and are negative.      Objective:   Physical Exam Vitals reviewed.  Constitutional:      General: He is not in acute distress.     Appearance: Normal appearance. He is obese. He is not ill-appearing, toxic-appearing or diaphoretic.  HENT:     Head: Normocephalic and atraumatic.     Right Ear: Tympanic membrane, ear canal and external ear normal. There is no impacted cerumen.     Left Ear: Tympanic membrane, ear canal and external ear normal.     Nose: Nose normal. No congestion or rhinorrhea.     Mouth/Throat:     Mouth: Mucous membranes are moist.     Pharynx: Pharyngeal swelling and uvula swelling present. No oropharyngeal exudate.   Eyes:     General: No scleral icterus.       Right eye: No discharge.        Left eye: No discharge.     Extraocular Movements: Extraocular movements intact.     Conjunctiva/sclera: Conjunctivae normal.     Pupils: Pupils are equal, round, and reactive to light.  Neck:     Vascular: No carotid bruit.  Cardiovascular:     Rate and Rhythm: Normal rate and regular rhythm.     Pulses: Normal pulses.     Heart sounds: Normal heart sounds. No murmur. No friction rub. No gallop.   Pulmonary:     Effort: Pulmonary effort is normal. No respiratory distress.     Breath sounds: Normal breath sounds. No stridor. No wheezing, rhonchi or rales.  Chest:     Chest wall: No tenderness.  Abdominal:     General: Abdomen is flat. Bowel sounds are normal. There is no distension.     Palpations: Abdomen is soft. There is no mass.     Tenderness: There is no abdominal tenderness. There is no right CVA tenderness, left CVA tenderness, guarding or rebound.     Hernia: No hernia is present.  Musculoskeletal:        General: No swelling, tenderness, deformity or signs of injury.     Cervical back: Normal range of motion and neck supple. No rigidity or tenderness.     Right lower leg: No edema.     Left lower leg: No edema.  Lymphadenopathy:     Cervical: No cervical adenopathy.  Skin:    General: Skin is warm.     Capillary Refill: Capillary refill takes less than 2 seconds.     Coloration: Skin  is not jaundiced or pale.     Findings: No bruising, erythema, lesion or rash.  Neurological:     General: No focal deficit present.     Mental Status: He is alert and oriented to person, place, and time. Mental status is at baseline.  Cranial Nerves: No cranial nerve deficit.     Sensory: No sensory deficit.     Motor: No weakness.     Coordination: Coordination normal.     Gait: Gait normal.     Deep Tendon Reflexes: Reflexes normal.  Psychiatric:        Mood and Affect: Mood normal.        Behavior: Behavior normal.        Thought Content: Thought content normal.        Judgment: Judgment normal.           Assessment & Plan:  Chronic fatigue - Plan: CBC with Differential/Platelet, COMPLETE METABOLIC PANEL WITH GFR, TSH, Vitamin B12, Testosterone Total,Free,Bio, Males, Ambulatory referral to Sleep Studies  Screening cholesterol level - Plan: Lipid panel  Prostate cancer screening - Plan: PSA  Immunization History  Administered Date(s) Administered  . Influenza,inj,Quad PF,6+ Mos 03/03/2013  . PFIZER SARS-COV-2 Vaccination 04/22/2019, 05/23/2019  . Pneumococcal Conjugate-13 03/03/2013   Patient is due for Pneumovax 23 today.  Covid shot is up-to-date.  He defers the shingles vaccine for now.  I will screen for prostate cancer with PSA.  I will screen his cholesterol with fasting lipid panel.  His blood pressure is excellent.  Due to his chronic fatigue I will check a CBC, CMP, TSH, vitamin B12, as well as a testosterone level.  However based on his elevated BMI, and his obstructive posterior oropharynx, I feel the patient is at high risk for obstructive sleep apnea.  Therefore I recommended referral for a sleep study as I believe this Rudnicki be the cause of his fatigue.  Regular anticipatory guidance is provided.  He denies any depression, falls, or memory loss.

## 2019-07-22 NOTE — Addendum Note (Signed)
Addended by: Legrand Rams B on: 07/22/2019 01:54 PM   Modules accepted: Orders

## 2019-07-25 LAB — COMPLETE METABOLIC PANEL WITH GFR
AG Ratio: 2 (calc) (ref 1.0–2.5)
ALT: 18 U/L (ref 9–46)
AST: 22 U/L (ref 10–35)
Albumin: 4.2 g/dL (ref 3.6–5.1)
Alkaline phosphatase (APISO): 68 U/L (ref 35–144)
BUN: 13 mg/dL (ref 7–25)
CO2: 26 mmol/L (ref 20–32)
Calcium: 9.5 mg/dL (ref 8.6–10.3)
Chloride: 106 mmol/L (ref 98–110)
Creat: 0.92 mg/dL (ref 0.70–1.18)
GFR, Est African American: 93 mL/min/{1.73_m2} (ref >=60)
GFR, Est Non African American: 81 mL/min/{1.73_m2} (ref >=60)
Globulin: 2.1 g/dL (calc) (ref 1.9–3.7)
Glucose, Bld: 105 mg/dL — ABNORMAL HIGH (ref 65–99)
Potassium: 4 mmol/L (ref 3.5–5.3)
Sodium: 141 mmol/L (ref 135–146)
Total Bilirubin: 0.9 mg/dL (ref 0.2–1.2)
Total Protein: 6.3 g/dL (ref 6.1–8.1)

## 2019-07-25 LAB — CBC WITH DIFFERENTIAL/PLATELET
Absolute Monocytes: 456 cells/uL (ref 200–950)
Basophils Absolute: 52 cells/uL (ref 0–200)
Basophils Relative: 1.1 %
Eosinophils Absolute: 118 cells/uL (ref 15–500)
Eosinophils Relative: 2.5 %
HCT: 46 % (ref 38.5–50.0)
Hemoglobin: 15.6 g/dL (ref 13.2–17.1)
Lymphs Abs: 1278 cells/uL (ref 850–3900)
MCH: 31.7 pg (ref 27.0–33.0)
MCHC: 33.9 g/dL (ref 32.0–36.0)
MCV: 93.5 fL (ref 80.0–100.0)
MPV: 11.2 fL (ref 7.5–12.5)
Monocytes Relative: 9.7 %
Neutro Abs: 2797 cells/uL (ref 1500–7800)
Neutrophils Relative %: 59.5 %
Platelets: 205 10*3/uL (ref 140–400)
RBC: 4.92 10*6/uL (ref 4.20–5.80)
RDW: 12.8 % (ref 11.0–15.0)
Total Lymphocyte: 27.2 %
WBC: 4.7 10*3/uL (ref 3.8–10.8)

## 2019-07-25 LAB — TESTOSTERONE TOTAL,FREE,BIO, MALES
Albumin: 4.2 g/dL (ref 3.6–5.1)
Sex Hormone Binding: 41 nmol/L (ref 22–77)
Testosterone, Bioavailable: 96.1 ng/dL (ref 15.0–150.0)
Testosterone, Free: 49.9 pg/mL (ref 6.0–73.0)
Testosterone: 445 ng/dL (ref 250–827)

## 2019-07-25 LAB — LIPID PANEL
Cholesterol: 164 mg/dL (ref <200)
HDL: 39 mg/dL — ABNORMAL LOW (ref >=40)
LDL Cholesterol (Calc): 104 mg/dL (calc) — ABNORMAL HIGH
Non-HDL Cholesterol (Calc): 125 mg/dL (calc) (ref <130)
Total CHOL/HDL Ratio: 4.2 (calc) (ref <5.0)
Triglycerides: 118 mg/dL (ref <150)

## 2019-07-25 LAB — VITAMIN B12: Vitamin B-12: 584 pg/mL (ref 200–1100)

## 2019-07-25 LAB — PSA: PSA: 2.4 ng/mL (ref <=4.0)

## 2019-07-25 LAB — TSH: TSH: 2.02 mIU/L (ref 0.40–4.50)

## 2019-07-26 ENCOUNTER — Other Ambulatory Visit: Payer: Self-pay | Admitting: Family Medicine

## 2019-07-26 DIAGNOSIS — R5382 Chronic fatigue, unspecified: Secondary | ICD-10-CM

## 2019-07-26 DIAGNOSIS — R5383 Other fatigue: Secondary | ICD-10-CM

## 2019-08-04 ENCOUNTER — Ambulatory Visit (INDEPENDENT_AMBULATORY_CARE_PROVIDER_SITE_OTHER): Payer: Medicare Other | Admitting: Neurology

## 2019-08-04 ENCOUNTER — Other Ambulatory Visit: Payer: Self-pay

## 2019-08-04 ENCOUNTER — Encounter: Payer: Self-pay | Admitting: Neurology

## 2019-08-04 VITALS — BP 132/74 | HR 72 | Ht 74.0 in | Wt 249.3 lb

## 2019-08-04 DIAGNOSIS — R0683 Snoring: Secondary | ICD-10-CM | POA: Diagnosis not present

## 2019-08-04 DIAGNOSIS — E669 Obesity, unspecified: Secondary | ICD-10-CM | POA: Diagnosis not present

## 2019-08-04 DIAGNOSIS — G4719 Other hypersomnia: Secondary | ICD-10-CM

## 2019-08-04 NOTE — Patient Instructions (Addendum)
Thank you for choosing [facility] for your sleep related care! It was nice to meet you today! I appreciate that you entrust me with your sleep related healthcare concerns. I hope, I was able to address at least some of your concerns today, and that I can help you feel reassured and also get better.    Here is what we discussed today and what we came up with as our plan for you:    Based on your symptoms and your exam I believe you are at risk for obstructive sleep apnea (aka OSA), and I think we should proceed with a sleep study to determine whether you do or do not have OSA and how severe it is. Even, if you have mild OSA, I Guggisberg want you to consider treatment with CPAP, as treatment of even borderline or mild sleep apnea can result and improvement of symptoms such as sleep disruption, daytime sleepiness, nighttime bathroom breaks, restless leg symptoms, improvement of headache syndromes, even improved mood disorder.   Please remember, the long-term risks and ramifications of untreated moderate to severe obstructive sleep apnea are: increased Cardiovascular disease, including congestive heart failure, stroke, difficult to control hypertension, treatment resistant obesity, arrhythmias, especially irregular heartbeat commonly known as A. Fib. (atrial fibrillation); even type 2 diabetes has been linked to untreated OSA.   Sleep apnea can cause disruption of sleep and sleep deprivation in most cases, which, in turn, can cause recurrent headaches, problems with memory, mood, concentration, focus, and vigilance. Most people with untreated sleep apnea report excessive daytime sleepiness, which can affect their ability to drive. Please do not drive if you feel sleepy. Patients with sleep apnea developed difficulty initiating and maintaining sleep (aka insomnia).   Having sleep apnea Carbine increase your risk for other sleep disorders, including involuntary behaviors sleep such as sleep terrors,  sleep talking, sleepwalking.    Having sleep apnea can also increase your risk for restless leg syndrome and leg movements at night.   Please note that untreated obstructive sleep apnea Lamm carry additional perioperative morbidity. Patients with significant obstructive sleep apnea (typically, in the moderate to severe degree) should receive, if possible, perioperative PAP (positive airway pressure) therapy and the surgeons and particularly the anesthesiologists should be informed of the diagnosis and the severity of the sleep disordered breathing.   I will likely see you back after your sleep study to go over the test results and where to go from there. We will call you after your sleep study to advise about the results (most likely, you will hear from Kristen, my nurse) and to set up an appointment at the time, as necessary.    Our sleep lab administrative assistant will call you to schedule your sleep study and give you further instructions, regarding the check in process for the sleep study, arrival time, what to bring, when you can expect to leave after the study, etc., and to answer any other logistical questions you Rossin have. If you don't hear back from her by about 2 weeks from now, please feel free to call her direct line at [phone] or you can call our general clinic number, or email us through My Chart.   

## 2019-08-04 NOTE — Progress Notes (Signed)
Subjective:    Patient ID: Jose Owen is a 79 y.o. male.  HPI     Huston Foley, MD, PhD [facility] [address] [address] [place], [place] [address]  Dear Dr. Tanya Nones,   I saw your patient, Jose Owen, upon your kind request in my sleep clinic today for initial consultation of his sleep disorder, in particular, concern for underlying obstructive sleep apnea. The patient is unaccompanied today. As you know, Mr. Jose Owen is a 79 year old right-handed gentleman with an underlying medical history of low back pain, and mild obesity, who reports snoring and excessive daytime somnolence and prior history of low testosterone. I reviewed your office note from 07/22/2019. He had lab work at the time which showed benign results including lipid panel, TSH, B12, testosterone levels. His Epworth sleepiness score is 12 out of 24, fatigue severity score is 40 out of 63.  He lives alone, sadly, lost his wife in January 2021.  He is holding up okay, but reports that he has his moments.  He has 2 kids, daughter in [place] and son in [place].  He stays connected mostly over his iPhone with his kids and grandkids.  He does not drink caffeine daily, he does not drink alcohol, he quit smoking about 50 years ago.  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  He does get tired very easily during the day, tries not to take a nap.  He tries to stay busy.  He has had some weight fluctuation.  He has no pets in the household.  He denies night to night nocturia or recurrent morning headaches.  His snoring was mild as he recalls from what his wife used to tell him.  His sleep is interrupted at night, he wakes up 3-4 times per night without apparent reason.   His Past Medical History Is Significant For: History reviewed. No pertinent past medical history.  His Past Surgical History Is Significant For: History reviewed. No pertinent surgical history.  His Family History Is Significant  For: History reviewed. No pertinent family history.  His Social History Is Significant For: Social History   Socioeconomic History  . Marital status: Married    Spouse name: Not on file  . Number of children: Not on file  . Years of education: Not on file  . Highest education level: Not on file  Occupational History  . Not on file  Tobacco Use  . Smoking status: Former Smoker    Types: Cigarettes  . Smokeless tobacco: Never Used  Substance and Sexual Activity  . Alcohol use: No  . Drug use: No  . Sexual activity: Not on file  Other Topics Concern  . Not on file  Social History Narrative  . Not on file   Social Determinants of Health   Financial Resource Strain:   . Difficulty of Paying Living Expenses:   Food Insecurity:   . Worried About Programme researcher, broadcasting/film/video in the Last Year:   . Barista in the Last Year:   Transportation Needs:   . Freight forwarder (Medical):   Marland Kitchen Lack of Transportation (Non-Medical):   Physical Activity:   . Days of Exercise per Week:   . Minutes of Exercise per Session:   Stress:   . Feeling of Stress :   Social Connections:   . Frequency of Communication with Friends and Family:   . Frequency of Social Gatherings with Friends and Family:   . Attends  Religious Services:   . Active Member of Clubs or Organizations:   . Attends Archivist Meetings:   Marland Kitchen Marital Status:     His Allergies Are:  No Known Allergies:   His Current Medications Are:  Outpatient Encounter Medications as of 08/04/2019  Medication Sig  . ibuprofen (ADVIL) 200 MG tablet Take 200 mg by mouth every 6 (six) hours as needed.  . meloxicam (MOBIC) 15 MG tablet Take 0.5-1 tablets (7.5-15 mg total) by mouth daily as needed for pain.  . vitamin B-12 (CYANOCOBALAMIN) 1000 MCG tablet Take 1 tablet (1,000 mcg total) by mouth daily. (Patient not taking: Reported on 07/22/2019)   No facility-administered encounter medications on file as of 08/04/2019.   :  Review of Systems:  Out of a complete 14 point review of systems, all are reviewed and negative with the exception of these symptoms as listed below: Review of Systems  Neurological:       Here for sleep consult- no prior sleep study.  Epworth Sleepiness Scale 0= would never doze 1= slight chance of dozing 2= moderate chance of dozing 3= high chance of dozing  Sitting and reading: Watching TV: Sitting inactive in a public place (ex. Theater or meeting): As a passenger in a car for an hour without a break: Lying down to rest in the afternoon: Sitting and talking to someone: Sitting quietly after lunch (no alcohol): In a car, while stopped in traffic: Total:     Objective:  Neurological Exam  Physical Exam Physical Examination:   Vitals:   08/04/19 0809  BP: 132/74  Pulse: 72  SpO2: 93%    General Examination: The patient is a very pleasant 79 y.o. male in no acute distress. He appears well-developed and well-nourished and well groomed.   HEENT: Normocephalic, atraumatic, pupils are equal, round and reactive to light, extraocular tracking is good without limitation to gaze excursion or nystagmus noted. Hearing is grossly intact. Face is symmetric with normal facial animation. Speech is clear with no dysarthria noted. There is no hypophonia. There is no lip, neck/head, jaw or voice tremor. Neck is supple with full range of passive and active motion. There are no carotid bruits on auscultation. Oropharynx exam reveals: mild mouth dryness, adequate dental hygiene and moderate airway crowding, due to redundant soft palate, Mallampati class III, uvula not visualized fully.  Tonsils about 1+ bilaterally.  Tongue protrudes centrally in palate elevates symmetrically, neck circumference 18 inches.  He has a moderate overbite.  No nasal septal deviation noted.  Chest: Clear to auscultation without wheezing, rhonchi or crackles noted.  Heart: S1+S2+0, regular and normal without  murmurs, rubs or gallops noted.   Abdomen: Soft, non-tender and non-distended with normal bowel sounds appreciated on auscultation.  Extremities: There is no pitting edema in the distal lower extremities bilaterally.   Skin: Warm and dry without trophic changes noted.   Musculoskeletal: exam reveals no obvious joint deformities, tenderness or joint swelling or erythema.  He reports tenderness behind the knees and in the hamstring areas bilaterally.  Feels like he gets a cramp easily.  Neurologically:  Mental status: The patient is awake, alert and oriented in all 4 spheres. His immediate and remote memory, attention, language skills and fund of knowledge are appropriate. There is no evidence of aphasia, agnosia, apraxia or anomia. Speech is clear with normal prosody and enunciation. Thought process is linear. Mood is normal and affect is normal.  Cranial nerves II - XII are as described above under  HEENT exam.  Motor exam: Normal bulk, strength and tone is noted. There is no tremor, fine motor skills and coordination: grossly intact.  Cerebellar testing: No dysmetria or intention tremor. There is no truncal or gait ataxia.  Sensory exam: intact to light touch in the upper and lower extremities.  Gait, station and balance: He stands easily. No veering to one side is noted. No leaning to one side is noted. Posture is age-appropriate and stance is narrow based. Gait shows normal stride length and normal pace.  Slight limp initially on the right.  No problems turning.    Assessment and Plan:  In summary, Carla Whilden Buehner is a very pleasant 79 y.o.-year old male with a history and physical exam concerning for obstructive sleep apnea (OSA). I had a long chat with the patient about my findings and the diagnosis of OSA, its prognosis and treatment options. We talked about medical treatments, surgical interventions and non-pharmacological approaches. I explained in particular the risks and ramifications of  untreated moderate to severe OSA, especially with respect to developing cardiovascular disease down the Road, including congestive heart failure, difficult to treat hypertension, cardiac arrhythmias, or stroke. Even type 2 diabetes has, in part, been linked to untreated OSA. Symptoms of untreated OSA include daytime sleepiness, memory problems, mood irritability and mood disorder such as depression and anxiety, lack of energy, as well as recurrent headaches, especially morning headaches. We talked about trying to maintain a healthy lifestyle in general, as well as the importance of weight control. We also talked about the importance of good sleep hygiene. I recommended the following at this time: sleep study.   I explained the sleep test procedure to the patient and also outlined possible surgical and non-surgical treatment options of OSA, including the use of a custom-made dental device (which would require a referral to a specialist dentist or oral surgeon), upper airway surgical options (which would involve a referral to an ENT surgeon). I also explained the CPAP treatment option to the patient, who indicated that he would be willing to try CPAP if the need arises.  He is familiar with the CPAP therapy as his wife had sleep apnea and had a machine. I answered all his questions today and the patient was in agreement. I plan to see him back after the sleep study is completed and encouraged him to call with any interim questions, concerns, problems or updates.   Thank you very much for allowing me to participate in the care of this nice patient. If I can be of any further assistance to you please do not hesitate to call me at [phone].  Sincerely,   Huston Foley, MD, PhD

## 2019-08-11 ENCOUNTER — Telehealth: Payer: Self-pay

## 2019-08-11 NOTE — Telephone Encounter (Signed)
LVM for pt to call me back to schedule sleep study  

## 2019-08-16 ENCOUNTER — Telehealth: Payer: Self-pay | Admitting: Family Medicine

## 2019-08-16 DIAGNOSIS — M5442 Lumbago with sciatica, left side: Secondary | ICD-10-CM

## 2019-08-16 DIAGNOSIS — M5441 Lumbago with sciatica, right side: Secondary | ICD-10-CM

## 2019-08-16 NOTE — Telephone Encounter (Signed)
Patient called advised if he sit for over 30 minutes and stand up he is in severe pain. Patient asked if Dr Prince Rome can call him and advise him what he can do to help with the pain? The number to contact patient is [phone]

## 2019-08-16 NOTE — Telephone Encounter (Signed)
I called the patient: He is taking the meloxicam daily and doing his home exercises - he does the stretches before getting out of bed. His pain has improved, in that he no longer has pain with sitting/lying down. Now, he only has pressure with putting any pressure on his feet - walking after sitting and even with pushing down with his feet to scoot up in the bed. The patient's father is his 90s and the patient has to help take care of him. He said this is what bothers him the most - he cannot do as much for his father as he needs to do. Please advise.

## 2019-08-16 NOTE — Telephone Encounter (Signed)
I will refer him to PT for a few treatments.  If it doesn't help, then either an MRI or a one-time cortisone injection.

## 2019-08-16 NOTE — Telephone Encounter (Signed)
Left info about PT and next steps on the patient's mobile voice mail.

## 2019-08-22 ENCOUNTER — Telehealth: Payer: Self-pay | Admitting: Family Medicine

## 2019-08-22 DIAGNOSIS — M5442 Lumbago with sciatica, left side: Secondary | ICD-10-CM

## 2019-08-22 DIAGNOSIS — M5441 Lumbago with sciatica, right side: Secondary | ICD-10-CM

## 2019-08-22 MED ORDER — CELECOXIB 200 MG PO CAPS
200.0000 mg | ORAL_CAPSULE | Freq: Two times a day (BID) | ORAL | 6 refills | Status: DC | PRN
Start: 2019-08-22 — End: 2019-11-14

## 2019-08-22 NOTE — Telephone Encounter (Signed)
Patient called requesting a call back from Dr. Prince Rome or nurse. Patient has questions about his medication. Patient phone number is [phone].

## 2019-08-22 NOTE — Telephone Encounter (Signed)
Will call in celebrex to try instead of meloxicam and ibuprofen.  He should not take more than one NSAID at a time.  Will also try to get MRI approved.

## 2019-08-22 NOTE — Telephone Encounter (Signed)
I called the patient and advised him of this. [facility] has already called him and scheduled the MRI for mid-June. Until the patient can get his celebrex Rx from the pharmacy, he said he Pujol try switching his meloxicam to taking before bedtime to see if it works better for him. He understands he is not to take the ibuprofen with either the meloxicam nor the celebrex. He does tolerate Tylenol ok - Sulser take this during the day prn to see if it helps to ease any pain that returns between evening doses of meloxicam. The patient will let us know if he has any more concerns/questions.

## 2019-08-22 NOTE — Telephone Encounter (Signed)
I called the patient: he asked if he could take meloxicam bid (he has the 15 mg strength). The biggest problem he has right now is that he can hardly walk first thing in the morning. He has no back pain - his pain is in his legs (thighs down). He has tried taking 800 mg of ibuprofen at night - some improvement, but he is afraid he is taking too much medication. The patient can ride his lawnmower without issue. But, if he tries to then walk, he has much difficulty. His first PT visit is 08/31/19 - he is doing his home exercises. The patient also wonders if he should have an MRI of his Lsp.

## 2019-08-23 ENCOUNTER — Telehealth: Payer: Self-pay

## 2019-08-23 ENCOUNTER — Ambulatory Visit: Payer: Medicare Other | Admitting: Rehabilitative and Restorative Service Providers"

## 2019-08-23 NOTE — Telephone Encounter (Signed)
LVM for pt to call me back to schedule sleep study  We have attempted to call the patient two times to schedule sleep study.  Patient has been unavailable at the phone numbers we have on file and has not returned our calls.  If patient calls back we will schedule them for their sleep study.  

## 2019-08-31 ENCOUNTER — Encounter: Payer: Self-pay | Admitting: Rehabilitative and Restorative Service Providers"

## 2019-08-31 ENCOUNTER — Ambulatory Visit: Payer: Medicare Other | Attending: Family Medicine | Admitting: Rehabilitative and Restorative Service Providers"

## 2019-08-31 ENCOUNTER — Ambulatory Visit: Payer: Medicare Other | Admitting: Rehabilitative and Restorative Service Providers"

## 2019-08-31 ENCOUNTER — Other Ambulatory Visit: Payer: Self-pay

## 2019-08-31 DIAGNOSIS — G8929 Other chronic pain: Secondary | ICD-10-CM

## 2019-08-31 DIAGNOSIS — R293 Abnormal posture: Secondary | ICD-10-CM | POA: Diagnosis not present

## 2019-08-31 DIAGNOSIS — R262 Difficulty in walking, not elsewhere classified: Secondary | ICD-10-CM

## 2019-08-31 DIAGNOSIS — M5442 Lumbago with sciatica, left side: Secondary | ICD-10-CM | POA: Diagnosis not present

## 2019-08-31 DIAGNOSIS — M5441 Lumbago with sciatica, right side: Secondary | ICD-10-CM | POA: Insufficient documentation

## 2019-08-31 NOTE — Therapy (Signed)
[facility] [address] [place], [place], [address] Phone: [phone]   Fax:  [phone]  Physical Therapy Evaluation  Patient Details  Name: Jose Owen MRN: [id number] Date of Birth: 09/27/1940 Referring Provider (PT): Lavada Mesi MD   Encounter Date: 08/31/2019  PT End of Session - 08/31/19 1210    Visit Number  1    Number of Visits  16    Date for PT Re-Evaluation  10/26/19    PT Start Time  0935    PT Stop Time  1001    PT Time Calculation (min)  26 min    Activity Tolerance  Patient tolerated treatment well;No increased pain    Behavior During Therapy  [facility] for tasks assessed/performed       History reviewed. No pertinent past medical history.  History reviewed. No pertinent surgical history.  There were no vitals filed for this visit.   Subjective Assessment - 08/31/19 0936    Subjective  B shhoting pains down the leg for a month.  Symptoms appear multiple times per day.  X-ray shows the expected wear and tear with most arthritic changes at L4-S1.  MRI scheduled for next week.    Patient is accompained by:  Family member    Limitations  Sitting;Walking    How long can you sit comfortably?  Not limited.    How long can you stand comfortably?  Depends on the day.    How long can you walk comfortably?  With a cane he can do normal functional walking.    Diagnostic tests  X-ray, MRI scheduled.    Patient Stated Goals  Move normal without the leg pain.    Currently in Pain?  No/denies         [facility] PT Assessment - 08/31/19 0001      Assessment   Medical Diagnosis  LBP with sciatica    Referring Provider (PT)  Lavada Mesi MD    Onset Date/Surgical Date  07/30/19    Next MD Visit  MRI 09/05/19      Precautions   Precautions  Back    Precaution Booklet Issued  No    Precaution Comments  Avoid prolonged postures, bening, lifting and twisting.      Balance Screen   Has the patient fallen in the past 6  months  No    Has the patient had a decrease in activity level because of a fear of falling?   No    Is the patient reluctant to leave their home because of a fear of falling?   No      Prior Function   Level of Independence  Independent      Cognition   Overall Cognitive Status  Within Functional Limits for tasks assessed      Posture/Postural Control   Posture/Postural Control  Postural limitations    Postural Limitations  Rounded Shoulders;Forward head;Decreased lumbar lordosis    Posture Comments  Corrected at evaluation (on going)      ROM / Strength   AROM / PROM / Strength  AROM      AROM   AROM Assessment Site  Lumbar    Lumbar Extension  10      Bed Mobility   Bed Mobility  Rolling Right;Rolling Left;Right Sidelying to Sit;Left Sidelying to Sit;Supine to Sit;Sit to Supine;Sit to Sidelying Right;Sit to Sidelying Left      Ambulation/Gait   Ambulation/Gait  Yes    Ambulation/Gait Assistance  6: Modified independent (Device/Increase time)    Assistive device  Straight cane                  Objective measurements completed on examination: See above findings.              PT Education - 08/31/19 1208    Education Details  Discussed spine anatomy, the importance of frequent position changes, correct lumbar roll use, the importance of walking and reviewed his HEP.    Person(s) Educated  Patient;Child(ren)    Methods  Explanation;Demonstration;Verbal cues;Handout    Comprehension  Verbalized understanding;Need further instruction;Returned demonstration;Verbal cues required       PT Short Term Goals - 08/31/19 1216      PT SHORT TERM GOAL #1   Title  Jose Owen will be independent with his starter HEP.    Baseline  New program    Time  2    Period  Weeks    Status  New    Target Date  09/16/19        PT Long Term Goals - 08/31/19 1216      PT LONG TERM GOAL #1   Title  Jose Owen will report low back and thigh pain as consistently 0-3/10 on the  Numeric Pain Rating Scale.    Baseline  Can be as high as 7/10 and symptoms can be as distal as the ankles.    Time  8    Period  Weeks    Status  New    Target Date  10/28/19      PT LONG TERM GOAL #2   Title  Jose Owen will rate himself subjectively 80% improved at DC.    Baseline  "About 30%"    Time  8    Period  Weeks    Status  New    Target Date  10/28/19      PT LONG TERM GOAL #3   Title  Improve trunk extension AROM to 20 degrees.    Baseline  10 degrees    Time  8    Period  Weeks    Status  New    Target Date  10/28/19      PT LONG TERM GOAL #4   Title  Jose Owen will be independent and compliant with his final HEP at DC.    Baseline  Just started at evaluation.    Time  8    Period  Weeks    Status  New    Target Date  10/28/19             Plan - 08/31/19 1044    Clinical Impression Statement  Jose Owen had a recent acute onset of low back pain with bilateral sciatica.  His pposture and body mechanics were addressed and will need further attention.  Trunk extension AROM is limited and hip AROM/flexibility will need to be assessed as this is likely contributing to his pain (unable to assess due to patient being > 15 minutes late).  Core and general strengthening will also benefit Don.    Personal Factors and Comorbidities  Fitness    Examination-Activity Limitations  Bathing;Dressing;Bend;Lift;Squat;Locomotion Level;Stairs;Carry;Stand    Examination-Participation Restrictions  Yard Work;Cleaning;Laundry;Community Activity    Stability/Clinical Decision Making  Stable/Uncomplicated    Clinical Decision Making  Low    Rehab Potential  Good    PT Frequency  2x / week    PT Duration  8 weeks    PT Treatment/Interventions  ADLs/Self Care  Home Management;Cryotherapy;Traction;Therapeutic activities;Functional mobility training;Stair training;Gait training;Therapeutic exercise;Neuromuscular re-education;Patient/family education;Manual techniques    PT Next Visit Plan  Assess hip  flexibility.  Progress body mechanics and postural activities, core and general strengthening.    PT Home Exercise Plan  Access Code: [email or web address]    Consulted and Agree with Plan of Care  Family member/caregiver       Patient will benefit from skilled therapeutic intervention in order to improve the following deficits and impairments:  Abnormal gait, Decreased activity tolerance, Decreased endurance, Decreased knowledge of precautions, Decreased range of motion, Decreased mobility, Decreased strength, Difficulty walking, Hypomobility, Impaired flexibility, Increased muscle spasms, Postural dysfunction, Improper body mechanics, Pain  Visit Diagnosis: Chronic bilateral low back pain with left-sided sciatica  Chronic bilateral low back pain with right-sided sciatica  Abnormal posture  Difficulty walking     Problem List Patient Active Problem List   Diagnosis Date Noted  . Fatigue 12/05/2016    Farley Ly PT, MPT 08/31/2019, 12:23 PM  [facility] [address] [place], [place], [address] Phone: [phone]   Fax:  [phone]  Name: Brownie Nehme Sorn MRN: [id number] Date of Birth: 05-03-1940

## 2019-08-31 NOTE — Patient Instructions (Signed)
Access Code: [email or web address] URL: [email or web address] Date: 08/31/2019 Prepared by: Pauletta Browns  Exercises Standing Scapular Retraction - 5 x daily - 7 x weekly - 1 sets - 5 reps - 5 hold Standing Lumbar Extension at Wall - Forearms - 5 x daily - 7 x weekly - 1 sets - 5 reps - 3 hold

## 2019-09-02 ENCOUNTER — Encounter: Payer: Self-pay | Admitting: Physical Therapy

## 2019-09-02 ENCOUNTER — Ambulatory Visit: Payer: Medicare Other | Admitting: Physical Therapy

## 2019-09-02 ENCOUNTER — Other Ambulatory Visit: Payer: Self-pay

## 2019-09-02 DIAGNOSIS — M5441 Lumbago with sciatica, right side: Secondary | ICD-10-CM | POA: Diagnosis not present

## 2019-09-02 DIAGNOSIS — M5442 Lumbago with sciatica, left side: Secondary | ICD-10-CM | POA: Diagnosis not present

## 2019-09-02 DIAGNOSIS — G8929 Other chronic pain: Secondary | ICD-10-CM | POA: Diagnosis not present

## 2019-09-02 DIAGNOSIS — R262 Difficulty in walking, not elsewhere classified: Secondary | ICD-10-CM | POA: Diagnosis not present

## 2019-09-02 DIAGNOSIS — R293 Abnormal posture: Secondary | ICD-10-CM | POA: Diagnosis not present

## 2019-09-02 NOTE — Patient Instructions (Signed)
Access Code: [email or web address] URL: [email or web address] Date: 09/02/2019 Prepared by: Jannette Spanner  Exercises Standing Scapular Retraction - 5 x daily - 7 x weekly - 1 sets - 5 reps - 5 hold Standing Lumbar Extension at Wall - Forearms - 5 x daily - 7 x weekly - 1 sets - 5 reps - 3 hold Supine Bridge - 2 x daily - 7 x weekly - 3 sets - 10 reps Clamshell - 1 x daily - 7 x weekly - 3 sets - 10 reps Sidelying Reverse Clamshell - 1 x daily - 7 x weekly - 3 sets - 10 reps

## 2019-09-02 NOTE — Therapy (Signed)
Jose Owen [facility] [address] [place], [place], [address] Phone: [phone]   Fax:  [phone]  Physical Therapy Treatment  Patient Details  Name: Jose Owen MRN: [id number] Date of Birth: 09-30-1940 Referring Provider (PT): Lavada Mesi MD   Encounter Date: 09/02/2019  PT End of Session - 09/02/19 1105    Visit Number  2    Number of Visits  16    Date for PT Re-Evaluation  10/26/19    PT Start Time  1103    PT Stop Time  1143    PT Time Calculation (min)  40 min       History reviewed. No pertinent past medical history.  History reviewed. No pertinent surgical history.  There were no vitals filed for this visit.  Subjective Assessment - 09/02/19 1112    Subjective  Shooting pains are better overall, worse with sitting or laying prolonged. Pain is traveling to posterior thighs. No pain in back.    Currently in Pain?  Yes    Pain Score  2     Pain Location  Leg    Pain Orientation  Upper;Left;Right    Pain Descriptors / Indicators  Cramping    Aggravating Factors   laying, sitting    Pain Relieving Factors  keep moving, dont sit too long         [facility] PT Assessment - 09/02/19 0001      ROM / Strength   AROM / PROM / Strength  AROM;PROM;Strength      PROM   Overall PROM Comments  Hips WNL bilat except slight decrease in IR bilateral       Strength   Strength Assessment Site  Hip    Right/Left Hip  Right;Left    Right Hip ABduction  2+/5    Left Hip ABduction  3/5      Flexibility   Soft Tissue Assessment /Muscle Length  yes    Hamstrings  75-80 degrees bilat     ITB  WFL    Piriformis  WFL                                                                          [facility] Adult PT Treatment/Exercise - 09/02/19 0001      Lumbar Exercises: Stretches   Standing Extension Limitations  5 sec x 10 reps       Lumbar Exercises: Standing   Scapular Retraction  10 reps    Scapular  Retraction Limitations  5 seconds     Other Standing Lumbar Exercises  sit-stand with focus on alignment of spine , no UE x 10       Lumbar Exercises: Supine   Bridge  10 reps    Bridge Limitations  2 sets      Lumbar Exercises: Sidelying   Clam  Right;Left;15 reps    Other Sidelying Lumbar Exercises  reverse clam x 15 each , RT/LT              PT Education - 09/02/19 1137    Education Details  HEP    Person(s) Educated  Patient    Methods  Explanation;Handout    Comprehension  Verbalized understanding  PT Short Term Goals - 08/31/19 1216      PT SHORT TERM GOAL #1   Title  [name] will be independent with his starter HEP.    Baseline  New program    Time  2    Period  Weeks    Status  New    Target Date  09/16/19        PT Long Term Goals - 08/31/19 1216      PT LONG TERM GOAL #1   Title  [name] will report low back and thigh pain as consistently 0-3/10 on the Numeric Pain Rating Scale.    Baseline  Can be as high as 7/10 and symptoms can be as distal as the ankles.    Time  8    Period  Weeks    Status  New    Target Date  10/28/19      PT LONG TERM GOAL #2   Title  [name] will rate himself subjectively 80% improved at DC.    Baseline  "About 30%"    Time  8    Period  Weeks    Status  New    Target Date  10/28/19      PT LONG TERM GOAL #3   Title  Improve trunk extension AROM to 20 degrees.    Baseline  10 degrees    Time  8    Period  Weeks    Status  New    Target Date  10/28/19      PT LONG TERM GOAL #4   Title  [name] will be independent and compliant with his final HEP at DC.    Baseline  Just started at evaluation.    Time  8    Period  Weeks    Status  New    Target Date  10/28/19            Plan - 09/02/19 1106    Clinical Impression Statement  Pt reports legs are his problem with feeling leg fatigue. He as difficulty with donnig pants while standing and cannot complete long distance ambulation due to fatigue.  Hip PROM mostly WNL  and hamstrings approximately 75-80 begrees bilateral. Weakness noted in bilateral gluteal muscles. Updated HEP with strengthening. Instructed pt in spine alignemnent with sit-stands. Encouraged him to use upright posture at home and limit lumbar flexion.    PT Next Visit Plan  additional hip strength testing...Marland KitchenMarland KitchenProgress body mechanics and postural activities, core and general strengthening.    PT Home Exercise Plan  Access Code: [email or web address]       Patient will benefit from skilled therapeutic intervention in order to improve the following deficits and impairments:  Abnormal gait, Decreased activity tolerance, Decreased endurance, Decreased knowledge of precautions, Decreased range of motion, Decreased mobility, Decreased strength, Difficulty walking, Hypomobility, Impaired flexibility, Increased muscle spasms, Postural dysfunction, Improper body mechanics, Pain  Visit Diagnosis: Chronic bilateral low back pain with left-sided sciatica  Chronic bilateral low back pain with right-sided sciatica  Abnormal posture  Difficulty walking     Problem List Patient Active Problem List   Diagnosis Date Noted  . Fatigue 12/05/2016    Dorene Ar, PTA 09/02/2019, 12:44 PM  [facility] [address] [place], [place], [address] Phone: [phone]   Fax:  [phone]  Name: Jose Owen MRN: [id number] Date of Birth: [birth year]

## 2019-09-05 ENCOUNTER — Other Ambulatory Visit: Payer: Self-pay

## 2019-09-05 ENCOUNTER — Telehealth: Payer: Self-pay | Admitting: Family Medicine

## 2019-09-05 ENCOUNTER — Ambulatory Visit
Admission: RE | Admit: 2019-09-05 | Discharge: 2019-09-05 | Disposition: A | Discharge status: Home / Self Care | Payer: Medicare Other | Source: Ambulatory Visit | Attending: Family Medicine | Admitting: Family Medicine

## 2019-09-05 DIAGNOSIS — M5442 Lumbago with sciatica, left side: Secondary | ICD-10-CM

## 2019-09-05 DIAGNOSIS — M5441 Lumbago with sciatica, right side: Secondary | ICD-10-CM

## 2019-09-05 DIAGNOSIS — M48061 Spinal stenosis, lumbar region without neurogenic claudication: Secondary | ICD-10-CM | POA: Diagnosis not present

## 2019-09-05 NOTE — Telephone Encounter (Signed)
MRI scan lumbar spine shows bulging disks at L3-4 and L4-5.  At the L3-4 level there is moderate to severe narrowing of the nerve openings and both of the L4 nerve roots could be impinged upon.    There are defects in the L5 vertebra which results in the L5 vertebra being shifted forward compared to the S1 vertebra, which causes narrowing of both of the nerve openings at that level.  If his symptoms do not improve with physical therapy, we could refer him for epidural steroid injection.  Surgery will be the last resort option.

## 2019-09-06 NOTE — Telephone Encounter (Signed)
I called and advised patient of MRI results, he will be going to PT tomorrow, states that he is feeling some better.

## 2019-09-07 ENCOUNTER — Encounter: Payer: Self-pay | Admitting: Physical Therapy

## 2019-09-07 ENCOUNTER — Ambulatory Visit: Payer: Medicare Other | Admitting: Physical Therapy

## 2019-09-07 ENCOUNTER — Other Ambulatory Visit: Payer: Self-pay

## 2019-09-07 DIAGNOSIS — R262 Difficulty in walking, not elsewhere classified: Secondary | ICD-10-CM

## 2019-09-07 DIAGNOSIS — R293 Abnormal posture: Secondary | ICD-10-CM | POA: Diagnosis not present

## 2019-09-07 DIAGNOSIS — M5441 Lumbago with sciatica, right side: Secondary | ICD-10-CM | POA: Diagnosis not present

## 2019-09-07 DIAGNOSIS — G8929 Other chronic pain: Secondary | ICD-10-CM

## 2019-09-07 DIAGNOSIS — M5442 Lumbago with sciatica, left side: Secondary | ICD-10-CM | POA: Diagnosis not present

## 2019-09-07 NOTE — Patient Instructions (Signed)
Access Code: [email or web address] URL: [email or web address] Date: 09/07/2019 Prepared by: Jannette Spanner  Added:  Heel rises with counter support - 1 x daily - 7 x weekly - 2 sets - 10 reps Side Stepping with Counter Support - 1 x daily - 7 x weekly - 10 reps - 3 sets

## 2019-09-07 NOTE — Therapy (Signed)
[place], [place], [address] Phone: [phone]   Fax:  [phone]  Physical Therapy Treatment  Patient Details  Name: Jose Owen MRN: [id number] Date of Birth: December 29, 1940 Referring Provider (PT): Eunice Blase MD   Encounter Date: 09/07/2019  PT End of Session - 09/07/19 0931    Visit Number  3    Number of Visits  16    Date for PT Re-Evaluation  10/26/19    PT Start Time  0930    PT Stop Time  2355    PT Time Calculation (min)  45 min       History reviewed. No pertinent past medical history.  History reviewed. No pertinent surgical history.  There were no vitals filed for this visit.      [facility] PT Assessment - 09/07/19 0001      Strength   Right Hip Flexion  4-/5    Left Hip Flexion  4-/5                    [facility] Adult PT Treatment/Exercise - 09/07/19 0001      Lumbar Exercises: Aerobic   Nustep  L5 LE only x 5 minutes       Lumbar Exercises: Standing   Heel Raises  10 reps    Other Standing Lumbar Exercises  3 way hip at counter, side stepping , heel raises     Other Standing Lumbar Exercises  sit-stand with focus on alignment of spine , no UE x 15      Lumbar Exercises: Supine   Clam  20 reps    Clam Limitations  level red , bilat and single     Bridge  10 reps    Bridge Limitations  2 sets    Other Supine Lumbar Exercises  ball squeeze in hooklying x 10       Lumbar Exercises: Sidelying   Clam  10 reps    Clam Limitations  2 sets each     Other Sidelying Lumbar Exercises  reverse clam 10 x 2              PT Education - 09/07/19 1017    Education Details  HEP    Person(s) Educated  Patient    Methods  Explanation;Handout    Comprehension  Verbalized understanding       PT Short Term Goals - 08/31/19 1216      PT SHORT TERM GOAL #1   Title  Jose Owen will be independent with his starter HEP.    Baseline  New program    Time  2    Period  Weeks     Status  New    Target Date  09/16/19        PT Long Term Goals - 08/31/19 1216      PT LONG TERM GOAL #1   Title  Jose Owen will report low back and thigh pain as consistently 0-3/10 on the Numeric Pain Rating Scale.    Baseline  Can be as high as 7/10 and symptoms can be as distal as the ankles.    Time  8    Period  Weeks    Status  New    Target Date  10/28/19      PT LONG TERM GOAL #2   Title  Jose Owen will rate himself subjectively 80% improved at DC.    Baseline  "About 30%"    Time  8  Period  Weeks    Status  New    Target Date  10/28/19      PT LONG TERM GOAL #3   Title  Improve trunk extension AROM to 20 degrees.    Baseline  10 degrees    Time  8    Period  Weeks    Status  New    Target Date  10/28/19      PT LONG TERM GOAL #4   Title  Jose Owen will be independent and compliant with his final HEP at DC.    Baseline  Just started at evaluation.    Time  8    Period  Weeks    Status  New    Target Date  10/28/19            Plan - 09/07/19 0933    Clinical Impression Statement  Pt had MRI 2 days ago and MD recommended injections and possily surgery. Pt arrives reporting legs are so fatigued be can barely walk. He reports that after 100-200 feet he has difficulty picking legs up.  Sleep is disturbed with movement in bed and he has difficulty with bed mobility. Reviewed HEP and progressed with closed chin LE strength. He has trendenerg weakness and gait, right hip weaker.    PT Next Visit Plan  additional hip strength testing...Marland KitchenMarland KitchenProgress body mechanics and postural activities, core and general strengthening.    PT Home Exercise Plan  Access Code: [email or web address]: scap sqeeze, lumbar extension, bridge, clam , reverse clam, heel raises, side stepping       Patient will benefit from skilled therapeutic intervention in order to improve the following deficits and impairments:  Abnormal gait, Decreased activity tolerance, Decreased endurance, Decreased knowledge of precautions,  Decreased range of motion, Decreased mobility, Decreased strength, Difficulty walking, Hypomobility, Impaired flexibility, Increased muscle spasms, Postural dysfunction, Improper body mechanics, Pain  Visit Diagnosis: Chronic bilateral low back pain with left-sided sciatica  Chronic bilateral low back pain with right-sided sciatica  Abnormal posture  Difficulty walking     Problem List Patient Active Problem List   Diagnosis Date Noted  . Fatigue 12/05/2016    Sherrie Mustache, PTA 09/07/2019, 10:27 AM  [facility] [address] [place], [place], [address] Phone: [phone]   Fax:  [phone]  Name: Jose Owen MRN: [id number] Date of Birth: [birth year]

## 2019-09-09 ENCOUNTER — Encounter: Payer: Self-pay | Admitting: Physical Therapy

## 2019-09-09 ENCOUNTER — Ambulatory Visit: Payer: Medicare Other | Admitting: Physical Therapy

## 2019-09-09 ENCOUNTER — Other Ambulatory Visit: Payer: Self-pay

## 2019-09-09 DIAGNOSIS — R262 Difficulty in walking, not elsewhere classified: Secondary | ICD-10-CM

## 2019-09-09 DIAGNOSIS — G8929 Other chronic pain: Secondary | ICD-10-CM | POA: Diagnosis not present

## 2019-09-09 DIAGNOSIS — M5441 Lumbago with sciatica, right side: Secondary | ICD-10-CM | POA: Diagnosis not present

## 2019-09-09 DIAGNOSIS — R293 Abnormal posture: Secondary | ICD-10-CM

## 2019-09-09 DIAGNOSIS — M5442 Lumbago with sciatica, left side: Secondary | ICD-10-CM | POA: Diagnosis not present

## 2019-09-09 NOTE — Therapy (Signed)
[place], [place], [address] Phone: [phone]   Fax:  [phone]  Physical Therapy Treatment  Patient Details  Name: Tarry Fountain Langner MRN: [id number] Date of Birth: 07/08/1940 Referring Provider (PT): Eunice Blase MD   Encounter Date: 09/09/2019   PT End of Session - 09/09/19 1051    Visit Number 4    Number of Visits 16    Date for PT Re-Evaluation 10/26/19    PT Start Time 0930    PT Stop Time 9379    PT Time Calculation (min) 45 min           History reviewed. No pertinent past medical history.  History reviewed. No pertinent surgical history.  There were no vitals filed for this visit.   Subjective Assessment - 09/09/19 0851    Subjective Back does not hurt at all anymore. Legs pain is more intermittent. Fatigue and weakness are the most bothersome.    Currently in Pain? No/denies                             [facility] Adult PT Treatment/Exercise - 09/09/19 0001      Lumbar Exercises: Stretches   Standing Extension Limitations 5 sec x 10 reps    LOB backward , required min assist to recover     Lumbar Exercises: Standing   Heel Raises 15 reps    Other Standing Lumbar Exercises 3 way hip at counter, side stepping , heel raises     Other Standing Lumbar Exercises sit-stand with focus on alignment of spine , no UE x 15      Lumbar Exercises: Supine   Clam 20 reps    Clam Limitations green , bilat and unilateral     Bridge 10 reps    Bridge Limitations 2 sets    Other Supine Lumbar Exercises ball squeeze in hooklying x 10       Lumbar Exercises: Sidelying   Clam 10 reps    Clam Limitations 2 sets each     Hip Abduction 10 reps    Other Sidelying Lumbar Exercises reverse clam 10 x 2                     PT Short Term Goals - 08/31/19 1216      PT SHORT TERM GOAL #1   Title [name] will be independent with his starter HEP.    Baseline New program    Time 2      Period Weeks    Status New    Target Date 09/16/19             PT Long Term Goals - 08/31/19 1216      PT LONG TERM GOAL #1   Title [name] will report low back and thigh pain as consistently 0-3/10 on the Numeric Pain Rating Scale.    Baseline Can be as high as 7/10 and symptoms can be as distal as the ankles.    Time 8    Period Weeks    Status New    Target Date 10/28/19      PT LONG TERM GOAL #2   Title [name] will rate himself subjectively 80% improved at DC.    Baseline "About 30%"    Time 8    Period Weeks    Status New    Target Date 10/28/19      PT LONG  TERM GOAL #3   Title Improve trunk extension AROM to 20 degrees.    Baseline 10 degrees    Time 8    Period Weeks    Status New    Target Date 10/28/19      PT LONG TERM GOAL #4   Title [name] will be independent and compliant with his final HEP at DC.    Baseline Just started at evaluation.    Time 8    Period Weeks    Status New    Target Date 10/28/19                 Plan - 09/09/19 1058    Clinical Impression Statement Mr. Camposano reports decreased overall back pain and more intermittent leg pain. He is performing all HEP as instructed. He demostrates trendelenberg weakness with ambulation and has very little rom with standing heel and toe raises. One episode of nearly falling backward while performing standing lumbar extensions. He continues to ambulate with walking stick and reports quick onset of leg fatigue with ambulation.    PT Next Visit Plan additional hip strength testing...Marland KitchenMarland KitchenProgress body mechanics and postural activities, core and general strengthening.    PT Home Exercise Plan Access Code: [email or web address]: scap sqeeze, lumbar extension, bridge, clam , reverse clam, heel raises, side stepping           Patient will benefit from skilled therapeutic intervention in order to improve the following deficits and impairments:  Abnormal gait, Decreased activity tolerance, Decreased endurance, Decreased  knowledge of precautions, Decreased range of motion, Decreased mobility, Decreased strength, Difficulty walking, Hypomobility, Impaired flexibility, Increased muscle spasms, Postural dysfunction, Improper body mechanics, Pain  Visit Diagnosis: Chronic bilateral low back pain with left-sided sciatica  Chronic bilateral low back pain with right-sided sciatica  Abnormal posture  Difficulty walking     Problem List Patient Active Problem List   Diagnosis Date Noted  . Fatigue 12/05/2016    Sherrie Mustache, PTA 09/09/2019, 11:02 AM  [facility] [address] [place], [place], [address] Phone: [phone]   Fax:  [phone]  Name: Roberta Kelly Spieker MRN: [id number] Date of Birth: 1940/12/10

## 2019-09-13 ENCOUNTER — Ambulatory Visit: Payer: Medicare Other | Admitting: Physical Therapy

## 2019-09-13 ENCOUNTER — Encounter: Payer: Self-pay | Admitting: Physical Therapy

## 2019-09-13 ENCOUNTER — Other Ambulatory Visit: Payer: Self-pay

## 2019-09-13 DIAGNOSIS — G8929 Other chronic pain: Secondary | ICD-10-CM | POA: Diagnosis not present

## 2019-09-13 DIAGNOSIS — R293 Abnormal posture: Secondary | ICD-10-CM

## 2019-09-13 DIAGNOSIS — M5442 Lumbago with sciatica, left side: Secondary | ICD-10-CM | POA: Diagnosis not present

## 2019-09-13 DIAGNOSIS — R262 Difficulty in walking, not elsewhere classified: Secondary | ICD-10-CM

## 2019-09-13 DIAGNOSIS — M5441 Lumbago with sciatica, right side: Secondary | ICD-10-CM

## 2019-09-13 NOTE — Patient Instructions (Signed)
Access Code: [email or web address] URL: [email or web address] Date: 09/13/2019 Prepared by: Jannette Spanner  Exercises Seated Ankle Dorsiflexion with Resistance - 1 x daily - 7 x weekly - 10 reps - 3 sets Seated Ankle Plantarflexion with Resistance - 1 x daily - 7 x weekly - 10 reps - 3 sets

## 2019-09-13 NOTE — Therapy (Signed)
[facility] [address] [place], [place], [address] Phone: [phone]   Fax:  [phone]  Physical Therapy Treatment  Patient Details  Name: Jose Owen MRN: [id number] Date of Birth: 1940-07-22 Referring Provider (PT): Lavada Mesi MD   Encounter Date: 09/13/2019   PT End of Session - 09/13/19 0812    Visit Number 5    Number of Visits 16    Date for PT Re-Evaluation 10/26/19    PT Start Time 0800    PT Stop Time 0845    PT Time Calculation (min) 45 min           History reviewed. No pertinent past medical history.  History reviewed. No pertinent surgical history.  There were no vitals filed for this visit.   Subjective Assessment - 09/13/19 0813    Subjective Not so much back pain. Pain runs down legs sometimes, random. No having pain now, just legs feel tired.    Currently in Pain? No/denies              [name] PT Assessment - 09/13/19 0001      Strength   Strength Assessment Site Ankle    Right/Left Ankle Right;Left    Right Ankle Dorsiflexion 4-/5    Right Ankle Plantar Flexion 2/5   unable to lift   Left Ankle Dorsiflexion 4-/5    Left Ankle Plantar Flexion 2/5   unable to lift                        [facility] Adult PT Treatment/Exercise - 09/13/19 0001      Lumbar Exercises: Aerobic   Nustep L5 LE only x 7 minutes       Lumbar Exercises: Standing   Heel Raises 15 reps    Heel Raises Limitations slight lift    Other Standing Lumbar Exercises 3 way hip at counter, side stepping , heel raises     Other Standing Lumbar Exercises sit-stand with focus on alignment of spine , no UE x 15      Lumbar Exercises: Supine   Clam 20 reps    Clam Limitations green , bilat and unilateral     Bent Knee Raise 20 reps    Bridge 10 reps    Bridge Limitations 2 sets    Bridge with [employer] 20 reps    Other Supine Lumbar Exercises DF/PF green band 20 reps each -also instrcuted in sitting for  HEP    Other Supine Lumbar Exercises ball squeeze in hooklying x 10       Lumbar Exercises: Sidelying   Clam 10 reps    Clam Limitations 2 sets each     Hip Abduction 10 reps   2 sets   Other Sidelying Lumbar Exercises reverse clam 10 x 2                   PT Education - 09/13/19 0951    Education Details HEP    Person(s) Educated Patient    Methods Explanation    Comprehension Verbalized understanding            PT Short Term Goals - 09/13/19 0814      PT SHORT TERM GOAL #1   Title [name] will be independent with his starter HEP.    Time 2    Period Weeks    Status Achieved             PT  Long Term Goals - 08/31/19 1216      PT LONG TERM GOAL #1   Title [name] will report low back and thigh pain as consistently 0-3/10 on the Numeric Pain Rating Scale.    Baseline Can be as high as 7/10 and symptoms can be as distal as the ankles.    Time 8    Period Weeks    Status New    Target Date 10/28/19      PT LONG TERM GOAL #2   Title [name] will rate himself subjectively 80% improved at DC.    Baseline "About 30%"    Time 8    Period Weeks    Status New    Target Date 10/28/19      PT LONG TERM GOAL #3   Title Improve trunk extension AROM to 20 degrees.    Baseline 10 degrees    Time 8    Period Weeks    Status New    Target Date 10/28/19      PT LONG TERM GOAL #4   Title [name] will be independent and compliant with his final HEP at DC.    Baseline Just started at evaluation.    Time 8    Period Weeks    Status New    Target Date 10/28/19                 Plan - 09/13/19 0951    Clinical Impression Statement Pt reports intermittnet 10/10 pain that shoots down posterior thighs. He is compliant with HEP frequency. He is concerned about co-pay and Flow consider decreasing attendance. REviewed HEP and progressed with theraband strengthening for ankle PF and DF. Updated HEP.    PT Next Visit Plan Progress body mechanics and postural activities, core and  general strengthening. Possibly add strength or balance goal?    PT Home Exercise Plan Access Code: [email or web address]: scap sqeeze, lumbar extension, bridge, clam , reverse clam, heel raises, side stepping, green band seated DF and PF           Patient will benefit from skilled therapeutic intervention in order to improve the following deficits and impairments:  Abnormal gait, Decreased activity tolerance, Decreased endurance, Decreased knowledge of precautions, Decreased range of motion, Decreased mobility, Decreased strength, Difficulty walking, Hypomobility, Impaired flexibility, Increased muscle spasms, Postural dysfunction, Improper body mechanics, Pain  Visit Diagnosis: Chronic bilateral low back pain with right-sided sciatica  Chronic bilateral low back pain with left-sided sciatica  Abnormal posture  Difficulty walking     Problem List Patient Active Problem List   Diagnosis Date Noted  . Fatigue 12/05/2016    Dorene Ar, PTA 09/13/2019, 10:24 AM  [place], [place], [address] Phone: [phone]   Fax:  [phone]  Name: Jose Owen MRN: [id number] Date of Birth: Mar 07, 1941

## 2019-09-14 ENCOUNTER — Other Ambulatory Visit: Payer: Medicare Other

## 2019-09-16 ENCOUNTER — Ambulatory Visit: Payer: Medicare Other

## 2019-09-16 ENCOUNTER — Other Ambulatory Visit: Payer: Self-pay

## 2019-09-16 DIAGNOSIS — R262 Difficulty in walking, not elsewhere classified: Secondary | ICD-10-CM

## 2019-09-16 DIAGNOSIS — M5442 Lumbago with sciatica, left side: Secondary | ICD-10-CM

## 2019-09-16 DIAGNOSIS — M5441 Lumbago with sciatica, right side: Secondary | ICD-10-CM | POA: Diagnosis not present

## 2019-09-16 DIAGNOSIS — R293 Abnormal posture: Secondary | ICD-10-CM

## 2019-09-16 DIAGNOSIS — G8929 Other chronic pain: Secondary | ICD-10-CM | POA: Diagnosis not present

## 2019-09-18 NOTE — Therapy (Signed)
[facility] [address] [place], [place], [address] Phone: [phone]   Fax:  [phone]  Physical Therapy Treatment  Patient Details  Name: Jose Owen MRN: [id number] Date of Birth: [birth year] Referring Provider (PT): Lavada Mesi MD   Encounter Date: 09/16/2019   PT End of Session - 09/18/19 1514    Visit Number 6    Number of Visits 16    Date for PT Re-Evaluation 10/26/19    PT Start Time 0835    PT Stop Time 0920    PT Time Calculation (min) 45 min    Activity Tolerance Patient tolerated treatment well;No increased pain    Behavior During Therapy [facility] for tasks assessed/performed           History reviewed. No pertinent past medical history.  History reviewed. No pertinent surgical history.  There were no vitals filed for this visit.   Subjective Assessment - 09/18/19 1415    Subjective Pt reports his back is bothering more this AM after staying at his sister's to care for his father and sleeping on a pull out bed. Overall, pt reports his low back has been feeling better.    Currently in Pain? Yes    Pain Score 5     Pain Location Back    Pain Orientation Lower    Pain Descriptors / Indicators Aching    Pain Type Chronic pain                             [facility] Adult PT Treatment/Exercise - 09/18/19 0001      Ambulation/Gait   Assistive device Straight cane    Gait Pattern Trendelenburg      Lumbar Exercises: Aerobic   Nustep 5 mins; L5; legs      Lumbar Exercises: Standing   Other Standing Lumbar Exercises Hip hike 10x2; in door way      Lumbar Exercises: Supine   Pelvic Tilt 10 reps    Pelvic Tilt Limitations 3 sec    Dead Bug 10 reps;2 seconds    Dead Bug Limitations 2 sets    Other Supine Lumbar Exercises mini ab cruch 10x      Lumbar Exercises: Sidelying   Clam 20 reps    Hip Abduction 10 reps   pt is only able to lift a few inches                 PT  Education - 09/18/19 1512    Education Details HEP: hip hike and hip abd; and PPT, mini ab cruch, and 90/90 ab heel touches.    Person(s) Educated Patient    Methods Explanation;Demonstration;Tactile cues;Verbal cues;Handout    Comprehension Verbalized understanding;Returned demonstration;Verbal cues required;Tactile cues required;Need further instruction            PT Short Term Goals - 09/13/19 0814      PT SHORT TERM GOAL #1   Title Jose Owen will be independent with his starter HEP.    Time 2    Period Weeks    Status Achieved             PT Long Term Goals - 08/31/19 1216      PT LONG TERM GOAL #1   Title Jose Owen will report low back and thigh pain as consistently 0-3/10 on the Numeric Pain Rating Scale.    Baseline Can be as high as 7/10 and symptoms can be as distal  as the ankles.    Time 8    Period Weeks    Status New    Target Date 10/28/19      PT LONG TERM GOAL #2   Title Jose Owen will rate himself subjectively 80% improved at DC.    Baseline "About 30%"    Time 8    Period Weeks    Status New    Target Date 10/28/19      PT LONG TERM GOAL #3   Title Improve trunk extension AROM to 20 degrees.    Baseline 10 degrees    Time 8    Period Weeks    Status New    Target Date 10/28/19      PT LONG TERM GOAL #4   Title Jose Owen will be independent and compliant with his final HEP at DC.    Baseline Just started at evaluation.    Time 8    Period Weeks    Status New    Target Date 10/28/19                 Plan - 09/18/19 1530    Clinical Impression Statement Pt was able to complete standing back ext and scapular retractions several reps and resolve his low back pain. Pt's significant hip abduction weakness appears to be a factor re: his balance and back pain. Hip hiking and hip abd exs, as well as abdominal strengthening exs to address his hip weakness and forward hips/lordotic posture.    Personal Factors and Comorbidities Fitness    Examination-Activity  Limitations Bathing;Dressing;Bend;Lift;Squat;Locomotion Level;Stairs;Carry;Stand    Examination-Participation Restrictions Yard Work;Cleaning;Laundry;Community Activity    Stability/Clinical Decision Making Stable/Uncomplicated    Clinical Decision Making Low    Rehab Potential Good    PT Frequency 2x / week    PT Duration 8 weeks    PT Treatment/Interventions ADLs/Self Care Home Management;Cryotherapy;Traction;Therapeutic activities;Functional mobility training;Stair training;Gait training;Therapeutic exercise;Neuromuscular re-education;Patient/family education;Manual techniques    PT Next Visit Plan Assess response to new exs.    PT Home Exercise Plan Access Code: [email or web address]: HEP: hip hike and hip abd; and PPT, mini ab cruch, and 90/90 ab heel touches.    Consulted and Agree with Plan of Care Patient           Patient will benefit from skilled therapeutic intervention in order to improve the following deficits and impairments:  Abnormal gait, Decreased activity tolerance, Decreased endurance, Decreased knowledge of precautions, Decreased range of motion, Decreased mobility, Decreased strength, Difficulty walking, Hypomobility, Impaired flexibility, Increased muscle spasms, Postural dysfunction, Improper body mechanics, Pain  Visit Diagnosis: Chronic bilateral low back pain with right-sided sciatica  Chronic bilateral low back pain with left-sided sciatica  Abnormal posture  Difficulty walking     Problem List Patient Active Problem List   Diagnosis Date Noted  . Fatigue 12/05/2016    Gar Ponto MS, PT 09/18/19 3:33 PM  [facility] [address] [place], [place], [address] Phone: [phone]   Fax:  [phone]  Name: Jose Owen MRN: [id number] Date of Birth: Sep 15, 1940

## 2019-09-21 ENCOUNTER — Ambulatory Visit: Payer: Medicare Other | Admitting: Physical Therapy

## 2019-09-23 ENCOUNTER — Ambulatory Visit: Payer: Medicare Other | Admitting: Physical Therapy

## 2019-09-23 ENCOUNTER — Encounter: Payer: Self-pay | Admitting: Physical Therapy

## 2019-09-23 ENCOUNTER — Other Ambulatory Visit: Payer: Self-pay

## 2019-09-23 DIAGNOSIS — G8929 Other chronic pain: Secondary | ICD-10-CM

## 2019-09-23 DIAGNOSIS — R262 Difficulty in walking, not elsewhere classified: Secondary | ICD-10-CM

## 2019-09-23 DIAGNOSIS — R293 Abnormal posture: Secondary | ICD-10-CM | POA: Diagnosis not present

## 2019-09-23 DIAGNOSIS — M5442 Lumbago with sciatica, left side: Secondary | ICD-10-CM | POA: Diagnosis not present

## 2019-09-23 DIAGNOSIS — M5441 Lumbago with sciatica, right side: Secondary | ICD-10-CM | POA: Diagnosis not present

## 2019-09-23 NOTE — Therapy (Signed)
[place], [place], [address] Phone: [phone]   Fax:  [phone]  Physical Therapy Treatment  Patient Details  Name: Jose Owen MRN: [id number] Date of Birth: [birth year] Referring Provider (PT): Eunice Blase MD   Encounter Date: 09/23/2019   PT End of Session - 09/23/19 1018    Visit Number 7    Number of Visits 16    Date for PT Re-Evaluation 10/26/19    PT Start Time 8546    PT Stop Time 1059    PT Time Calculation (min) 44 min           History reviewed. No pertinent past medical history.  History reviewed. No pertinent surgical history.  There were no vitals filed for this visit.   Subjective Assessment - 09/23/19 1017    Subjective Went fishing and sat too much.    Currently in Pain? No/denies    Pain Score --   intermittent 10/10 pains in legs                            [facility] Adult PT Treatment/Exercise - 09/23/19 0001      Lumbar Exercises: Aerobic   Nustep 7 mins; L6; legs      Lumbar Exercises: Standing   Heel Raises 15 reps    Other Standing Lumbar Exercises hip hike on 4inch step 10 x 2 , then with hip abduction 10 x 2 , side stepping with initial hip hike    Rt/LT     Lumbar Exercises: Supine   Pelvic Tilt 10 reps    Pelvic Tilt Limitations 5 sec     Dead Bug 10 reps;2 seconds    Dead Bug Limitations 2 sets    Bridge 10 reps    Other Supine Lumbar Exercises mini ab cruch 10x2      Lumbar Exercises: Sidelying   Clam 20 reps    Hip Abduction 10 reps   pt is only able to lift a few inches                   PT Short Term Goals - 09/13/19 2703      PT SHORT TERM GOAL #1   Title Jose Owen will be independent with his starter HEP.    Time 2    Period Weeks    Status Achieved             PT Long Term Goals - 08/31/19 1216      PT LONG TERM GOAL #1   Title Jose Owen will report low back and thigh pain as consistently 0-3/10 on the Numeric  Pain Rating Scale.    Baseline Can be as high as 7/10 and symptoms can be as distal as the ankles.    Time 8    Period Weeks    Status New    Target Date 10/28/19      PT LONG TERM GOAL #2   Title Jose Owen will rate himself subjectively 80% improved at DC.    Baseline "About 30%"    Time 8    Period Weeks    Status New    Target Date 10/28/19      PT LONG TERM GOAL #3   Title Improve trunk extension AROM to 20 degrees.    Baseline 10 degrees    Time 8    Period Weeks    Status New    Target  Date 10/28/19      PT LONG TERM GOAL #4   Title Jose Owen will be independent and compliant with his final HEP at DC.    Baseline Just started at evaluation.    Time 8    Period Weeks    Status New    Target Date 10/28/19                 Plan - 09/23/19 1050    Clinical Impression Statement Pt reports improvement in pain and has intermittent leg pain however he is mostly pain free in lumbar area. He reports improved gait distance without feeling like legs are fatgued. Will consider DC if he is still doing well next and is independent with HEP.    PT Next Visit Plan check goals, DC    PT Home Exercise Plan Access Code: [email or web address]: HEP: hip hike and hip abd; and PPT, mini ab cruch, and 90/90 ab heel touches.           Patient will benefit from skilled therapeutic intervention in order to improve the following deficits and impairments:  Abnormal gait, Decreased activity tolerance, Decreased endurance, Decreased knowledge of precautions, Decreased range of motion, Decreased mobility, Decreased strength, Difficulty walking, Hypomobility, Impaired flexibility, Increased muscle spasms, Postural dysfunction, Improper body mechanics, Pain  Visit Diagnosis: Chronic bilateral low back pain with right-sided sciatica  Chronic bilateral low back pain with left-sided sciatica  Abnormal posture  Difficulty walking     Problem List Patient Active Problem List   Diagnosis Date Noted  . Fatigue  12/05/2016    Sherrie Mustache, PTA 09/23/2019, 11:01 AM  [facility] [address] [place], [place], [address] Phone: [phone]   Fax:  [phone]  Name: Jose Owen MRN: [id number] Date of Birth: [birth year]

## 2019-09-28 ENCOUNTER — Other Ambulatory Visit: Payer: Self-pay

## 2019-09-28 ENCOUNTER — Encounter: Payer: Self-pay | Admitting: Physical Therapy

## 2019-09-28 ENCOUNTER — Ambulatory Visit: Payer: Medicare Other | Admitting: Physical Therapy

## 2019-09-28 DIAGNOSIS — R293 Abnormal posture: Secondary | ICD-10-CM | POA: Diagnosis not present

## 2019-09-28 DIAGNOSIS — G8929 Other chronic pain: Secondary | ICD-10-CM

## 2019-09-28 DIAGNOSIS — R262 Difficulty in walking, not elsewhere classified: Secondary | ICD-10-CM | POA: Diagnosis not present

## 2019-09-28 DIAGNOSIS — M5442 Lumbago with sciatica, left side: Secondary | ICD-10-CM

## 2019-09-28 DIAGNOSIS — M5441 Lumbago with sciatica, right side: Secondary | ICD-10-CM

## 2019-09-28 NOTE — Therapy (Addendum)
[place], [place], [address] Phone: [phone]   Fax:  [phone]  Physical Therapy Treatment/Discharge Summary  Patient Details  Name: Antuane Eastridge Bracamonte MRN: [id number] Date of Birth: [birth year] Referring Provider (PT): Eunice Blase MD   Encounter Date: 09/28/2019   PT End of Session - 09/28/19 1028    Visit Number 8    Number of Visits 16    Date for PT Re-Evaluation 10/26/19    PT Start Time 1017    PT Stop Time 1055    PT Time Calculation (min) 38 min           History reviewed. No pertinent past medical history.  History reviewed. No pertinent surgical history.  There were no vitals filed for this visit.   Subjective Assessment - 09/28/19 1025    Subjective I feel great some days , get tired and rest and then get back to working in the yard.              [facility] PT Assessment - 09/28/19 0001      AROM   Lumbar Extension 20      Strength   Right Hip Flexion 4/5    Right Hip ABduction 3/5    Left Hip Flexion 4/5    Left Hip ABduction 3/5    Right Ankle Plantar Flexion 2+/5    Left Ankle Plantar Flexion 2+/5                         [facility] Adult PT Treatment/Exercise - 09/28/19 0001      Lumbar Exercises: Aerobic   Nustep 8 mins; L6; legs      Lumbar Exercises: Standing   Heel Raises 15 reps    Other Standing Lumbar Exercises sit -stand x 10    Other Standing Lumbar Exercises hip hike on 4inch step 10 x 2 , then with hip abduction 10 x 2 , side stepping with initial hip hike    Rt/LT     Lumbar Exercises: Supine   Pelvic Tilt 10 reps    Pelvic Tilt Limitations 5 sec     Dead Bug 10 reps;2 seconds    Dead Bug Limitations 2 sets    Bridge 10 reps    Other Supine Lumbar Exercises mini ab cruch 10x2      Lumbar Exercises: Sidelying   Clam 20 reps    Hip Abduction 10 reps   pt is only able to lift a few inches                   PT Short Term Goals  - 09/13/19 0174      PT SHORT TERM GOAL #1   Title [name] will be independent with his starter HEP.    Time 2    Period Weeks    Status Achieved             PT Long Term Goals - 09/28/19 1035      PT LONG TERM GOAL #1   Title [name] will report low back and thigh pain as consistently 0-3/10 on the Numeric Pain Rating Scale.    Baseline back pain mostly resolved, intermittent sharp pains down legs with sit-stand and return from bending    Time 8    Period Weeks    Status Partially Met      PT LONG TERM GOAL #2   Title [name] will rate himself subjectively  80% improved at DC.    Baseline 100% improved per pt    Time 8    Period Weeks    Status Achieved      PT LONG TERM GOAL #3   Title Improve trunk extension AROM to 20 degrees.    Baseline 20 degrees    Time 8    Period Weeks    Status Achieved      PT LONG TERM GOAL #4   Title [name] will be independent and compliant with his final HEP at DC.    Baseline independent    Time 8    Period Weeks    Status Achieved                 Plan - 09/28/19 1030    Clinical Impression Statement Pt reports 100% improvement in funtion and abiity to walk longer distances up to 10 minutes, which was just 1-2 minutes at eval. He still needs rest breaks to complete his gardeneing however he can resume activity after 10 minutes rest. He gets intermittent sharp pains in legs with return from bending and getting up after sitting, which resolve quickly. He would like to conitnue with HEP independently due to high co-pay. He has made good progress and met or partially met his LTGs.    PT Next Visit Plan discharge TO HEP    PT Home Exercise Plan Access Code: [email or web address]: HEP: hip hike and hip abd; and PPT, mini ab cruch, and 90/90 ab heel touches.           Patient will benefit from skilled therapeutic intervention in order to improve the following deficits and impairments:  Abnormal gait, Decreased activity tolerance, Decreased endurance,  Decreased knowledge of precautions, Decreased range of motion, Decreased mobility, Decreased strength, Difficulty walking, Hypomobility, Impaired flexibility, Increased muscle spasms, Postural dysfunction, Improper body mechanics, Pain  Visit Diagnosis: Chronic bilateral low back pain with right-sided sciatica  Chronic bilateral low back pain with left-sided sciatica  Abnormal posture  Difficulty walking     Problem List Patient Active Problem List   Diagnosis Date Noted  . Fatigue 12/05/2016    Dorene Ar, PTA 09/28/2019, 11:46 AM   PHYSICAL THERAPY DISCHARGE SUMMARY  Visits from Start of Care: 8  Current functional level related to goals / functional outcomes: See above   Remaining deficits: See above   Education / Equipment: HEP   Plan: Patient agrees to discharge.  Patient goals were met. Patient is being discharged due to meeting the stated rehab goals.  ?????        Gar Ponto MS, PT 01/17/20 1:52 PM  [place] [facility] [address] [place], [place], [address] Phone: [phone]   Fax:  [phone]  Name: Jaycen Vercher Tiano MRN: [id number] Date of Birth: 1941-01-14

## 2019-10-05 ENCOUNTER — Ambulatory Visit: Payer: Medicare Other | Admitting: Physical Therapy

## 2019-10-12 ENCOUNTER — Encounter: Payer: Self-pay | Admitting: Physical Therapy

## 2019-10-26 ENCOUNTER — Encounter: Payer: Self-pay | Admitting: Physical Therapy

## 2019-11-14 ENCOUNTER — Ambulatory Visit (INDEPENDENT_AMBULATORY_CARE_PROVIDER_SITE_OTHER): Payer: Medicare Other | Admitting: Family Medicine

## 2019-11-14 ENCOUNTER — Encounter: Payer: Self-pay | Admitting: Family Medicine

## 2019-11-14 ENCOUNTER — Other Ambulatory Visit: Payer: Self-pay

## 2019-11-14 VITALS — BP 140/70 | HR 74 | Temp 97.6°F | Ht 74.0 in | Wt 249.0 lb

## 2019-11-14 DIAGNOSIS — N400 Enlarged prostate without lower urinary tract symptoms: Secondary | ICD-10-CM | POA: Insufficient documentation

## 2019-11-14 DIAGNOSIS — R5383 Other fatigue: Secondary | ICD-10-CM | POA: Diagnosis not present

## 2019-11-14 DIAGNOSIS — N5201 Erectile dysfunction due to arterial insufficiency: Secondary | ICD-10-CM

## 2019-11-14 DIAGNOSIS — N401 Enlarged prostate with lower urinary tract symptoms: Secondary | ICD-10-CM

## 2019-11-14 MED ORDER — TAMSULOSIN HCL 0.4 MG PO CAPS
0.4000 mg | ORAL_CAPSULE | Freq: Every day | ORAL | 3 refills | Status: DC
Start: 2019-11-14 — End: 2020-03-19

## 2019-11-14 NOTE — Progress Notes (Signed)
Subjective:    Patient ID: Jose Owen, male    DOB: Vernier 25, 1942, 79 y.o.   MRN: [id number]  HPI 07/22/19 Patient is here today for complete physical exam.  However he has several issues that he wants to discuss.  He is complaining of low back pain radiating down his right leg.  X-rays of the lumbar spine revealed degenerative disc disease throughout.  Patient recently saw orthopedist who recommended starting meloxicam for sciatica and degenerative disc disease.  He continues to have pain radiating down his right leg particularly at night when he is lying in bed.  He describes it as a deep aching pain in his posterior thigh and posterior calf.  He gets better if he is walking on it and worse if he sitting or laying down.  He denies any saddle anesthesia or bowel or bladder incontinence.  He denies any depression although he did recently lose his wife.  He denies any memory loss.  He denies any falls.  He is due for prostate cancer screening.  However he also discusses feeling extremely tired.  He states that he has no energy.  He has been told in the past that he has low testosterone.  He denies any chest pain shortness of breath or dyspnea on exertion.  However on his exam, his uvula is extremely large.  His airway is almost totally obstructed by his uvula and his tongue.  I am unable to examine his posterior oropharynx.  I believe that there is no way he cannot have sleep apnea based on his clinical appearance and his body habitus.  He does report hypersomnolence.  At that time, my plan was: Patient is due for Pneumovax 23 today.  Covid shot is up-to-date.  He defers the shingles vaccine for now.  I will screen for prostate cancer with PSA.  I will screen his cholesterol with fasting lipid panel.  His blood pressure is excellent.  Due to his chronic fatigue I will check a CBC, CMP, TSH, vitamin B12, as well as a testosterone level.  However based on his elevated BMI, and his obstructive posterior  oropharynx, I feel the patient is at high risk for obstructive sleep apnea.  Therefore I recommended referral for a sleep study as I believe this Elman be the cause of his fatigue.  Regular anticipatory guidance is provided.  He denies any depression, falls, or memory loss.  11/14/19 Patient saw [facility] neurology and they recommended a sleep study.  They try to contact the patient twice and left voice messages however he did not either get the message or return their call.  Therefore he has not scheduled a sleep study.  He was inquiring about that today.  He continues to endorse fatigue.  However he is now also started developing a weak stream.  He reports increasing urinary frequency.  However the urine stream comes out very weak or sometimes even with a dribble.  He will finish voiding, and then experience a dribbling incontinence after he is dressed.  He denies any nocturia.  He denies any urgency.  He does report hesitancy.  On examination today his prostate is swollen but nontender.  I do not appreciate any nodularity.  PSA was 2.4 in April.  He also complains of erectile dysfunction.  He would be an excellent candidate for daily Cialis. No past medical history on file. No past surgical history on file. Current Outpatient Medications on File Prior to Visit  Medication Sig Dispense Refill  .  meloxicam (MOBIC) 15 MG tablet Take 0.5-1 tablets (7.5-15 mg total) by mouth daily as needed for pain. 30 tablet 6  . vitamin B-12 (CYANOCOBALAMIN) 1000 MCG tablet Take 1 tablet (1,000 mcg total) by mouth daily.     No current facility-administered medications on file prior to visit.   No Known Allergies Social History   Socioeconomic History  . Marital status: Married    Spouse name: Not on file  . Number of children: Not on file  . Years of education: Not on file  . Highest education level: Not on file  Occupational History  . Not on file  Tobacco Use  . Smoking status: Former Smoker    Types:  Cigarettes  . Smokeless tobacco: Never Used  Substance and Sexual Activity  . Alcohol use: No  . Drug use: No  . Sexual activity: Not on file  Other Topics Concern  . Not on file  Social History Narrative  . Not on file   Social Determinants of Health   Financial Resource Strain:   . Difficulty of Paying Living Expenses:   Food Insecurity:   . Worried About Programme researcher, broadcasting/film/video in the Last Year:   . Barista in the Last Year:   Transportation Needs:   . Freight forwarder (Medical):   Marland Kitchen Lack of Transportation (Non-Medical):   Physical Activity:   . Days of Exercise per Week:   . Minutes of Exercise per Session:   Stress:   . Feeling of Stress :   Social Connections:   . Frequency of Communication with Friends and Family:   . Frequency of Social Gatherings with Friends and Family:   . Attends Religious Services:   . Active Member of Clubs or Organizations:   . Attends Banker Meetings:   Marland Kitchen Marital Status:   Intimate Partner Violence:   . Fear of Current or Ex-Partner:   . Emotionally Abused:   Marland Kitchen Physically Abused:   . Sexually Abused:    No family history on file. Father has a history of atrial fibrillation and prostate cancer.  Mother died due to old age.  He does have a family history of familial lipomatosis  Review of Systems  All other systems reviewed and are negative.      Objective:   Physical Exam Vitals reviewed.  Constitutional:      General: He is not in acute distress.    Appearance: Normal appearance. He is obese. He is not ill-appearing, toxic-appearing or diaphoretic.  HENT:     Head: Normocephalic and atraumatic.     Right Ear: Tympanic membrane, ear canal and external ear normal. There is no impacted cerumen.     Left Ear: Tympanic membrane, ear canal and external ear normal.     Nose: Nose normal. No congestion or rhinorrhea.     Mouth/Throat:     Mouth: Mucous membranes are moist.     Pharynx: Pharyngeal swelling and  uvula swelling present. No oropharyngeal exudate.   Eyes:     General: No scleral icterus.       Right eye: No discharge.        Left eye: No discharge.     Extraocular Movements: Extraocular movements intact.     Conjunctiva/sclera: Conjunctivae normal.     Pupils: Pupils are equal, round, and reactive to light.  Neck:     Vascular: No carotid bruit.  Cardiovascular:     Rate and Rhythm: Normal rate and regular  rhythm.     Pulses: Normal pulses.     Heart sounds: Normal heart sounds. No murmur heard.  No friction rub. No gallop.   Pulmonary:     Effort: Pulmonary effort is normal. No respiratory distress.     Breath sounds: Normal breath sounds. No stridor. No wheezing, rhonchi or rales.  Chest:     Chest wall: No tenderness.  Abdominal:     General: Abdomen is flat. Bowel sounds are normal. There is no distension.     Palpations: Abdomen is soft. There is no mass.     Tenderness: There is no abdominal tenderness. There is no right CVA tenderness, left CVA tenderness, guarding or rebound.     Hernia: No hernia is present.  Genitourinary:    Prostate: Enlarged. Not tender and no nodules present.  Musculoskeletal:        General: No swelling, tenderness, deformity or signs of injury.     Cervical back: Normal range of motion and neck supple. No rigidity or tenderness.     Right lower leg: No edema.     Left lower leg: No edema.  Lymphadenopathy:     Cervical: No cervical adenopathy.  Skin:    General: Skin is warm.     Capillary Refill: Capillary refill takes less than 2 seconds.     Coloration: Skin is not jaundiced or pale.     Findings: No bruising, erythema, lesion or rash.  Neurological:     General: No focal deficit present.     Mental Status: He is alert and oriented to person, place, and time. Mental status is at baseline.     Cranial Nerves: No cranial nerve deficit.     Sensory: No sensory deficit.     Motor: No weakness.     Coordination: Coordination normal.       Gait: Gait normal.     Deep Tendon Reflexes: Reflexes normal.  Psychiatric:        Mood and Affect: Mood normal.        Behavior: Behavior normal.        Thought Content: Thought content normal.        Judgment: Judgment normal.           Assessment & Plan:  Other fatigue  Erectile dysfunction due to arterial insufficiency  Benign prostatic hyperplasia with lower urinary tract symptoms, symptom details unspecified  I gave the patient the contact information and instructed him to call their office to schedule the sleep study is recommended.  I believe this is the most likely explanation for his fatigue.  We will try the patient on Flomax 0.4 mg p.o. nightly for his BPH with lower urinary tract symptoms.  If the patient is not seeing any benefit after 2 to 3 weeks, we way want to add Cialis 5 mg daily to try to address both the BPH as well as the erectile dysfunction

## 2019-11-15 ENCOUNTER — Telehealth: Payer: Self-pay

## 2019-11-15 ENCOUNTER — Ambulatory Visit: Payer: Medicare Other | Admitting: Family Medicine

## 2019-11-15 NOTE — Telephone Encounter (Signed)
LVM for pt to call me back to schedule sleep study  

## 2019-11-21 ENCOUNTER — Ambulatory Visit (INDEPENDENT_AMBULATORY_CARE_PROVIDER_SITE_OTHER): Payer: Medicare Other | Admitting: Neurology

## 2019-11-21 ENCOUNTER — Telehealth: Payer: Self-pay

## 2019-11-21 DIAGNOSIS — G4719 Other hypersomnia: Secondary | ICD-10-CM

## 2019-11-21 DIAGNOSIS — G4761 Periodic limb movement disorder: Secondary | ICD-10-CM

## 2019-11-21 DIAGNOSIS — G4720 Circadian rhythm sleep disorder, unspecified type: Secondary | ICD-10-CM

## 2019-11-21 DIAGNOSIS — E669 Obesity, unspecified: Secondary | ICD-10-CM

## 2019-11-21 DIAGNOSIS — G4733 Obstructive sleep apnea (adult) (pediatric): Secondary | ICD-10-CM | POA: Diagnosis not present

## 2019-11-21 DIAGNOSIS — R0683 Snoring: Secondary | ICD-10-CM

## 2019-11-21 NOTE — Telephone Encounter (Signed)
Have left patient multiple messages with no return call back. Reached out tho the daughter today (Emergency contact), gave her my direct number for pt to call me back.   I have patient scheduled for NPSG for tonight at 8pm

## 2019-12-02 NOTE — Procedures (Signed)
PATIENT'S NAME:  Gutzmer, Lamorris J. DOB:      [birth year]      MR#:    [id number]     DATE OF RECORDING: 11/21/2019 REFERRING M.D.:  Lynnea Ferrier, MD Study Performed:   Baseline Polysomnogram HISTORY: 79 year old man with a history of low back pain, and mild obesity, who reports snoring and excessive daytime somnolence and prior history of low testosterone levels. The patient endorsed the Epworth Sleepiness Scale at 12/24 points. The patient's weight 249 pounds with a height of 74 (inches), resulting in a BMI of 32. kg/m2. The patient's neck circumference measured 18 inches.  CURRENT MEDICATIONS: Advil, Mobic, Vitamin B12   PROCEDURE:  This is a multichannel digital polysomnogram utilizing the Somnostar 11.2 system.  Electrodes and sensors were applied and monitored per AASM Specifications.   EEG, EOG, Chin and Limb EMG, were sampled at 200 Hz.  ECG, Snore and Nasal Pressure, Thermal Airflow, Respiratory Effort, CPAP Flow and Pressure, Oximetry was sampled at 50 Hz. Digital video and audio were recorded.      BASELINE STUDY  Lights Out was at 20:54 and Lights On at 03:44.  Total recording time (TRT) was 410.5 minutes, with a total sleep time (TST) of 338.5 minutes.   The patient's sleep latency was 6 minutes.  REM latency was 67 minutes.  The sleep efficiency was 82.5 %.     SLEEP ARCHITECTURE: WASO (Wake after sleep onset) was 37.5 minutes with mild sleep fragmentation noted. There were 5.5 minutes in Stage N1, 239 minutes Stage N2, 62 minutes Stage N3 and 32 minutes in Stage REM.  The percentage of Stage N1 was 1.6%, Stage N2 was 70.6%, which is increased, Stage N3 was 18.3% and Stage R (REM sleep) was 9.5%, which is reduced. The arousals were noted as: 29 were spontaneous, 40 were associated with PLMs, 34 were associated with respiratory events.  RESPIRATORY ANALYSIS:  There were a total of 172 respiratory events:  29 obstructive apneas, 13 central apneas and 0 mixed apneas with a total of 42  apneas and an apnea index (AI) of 7.4 /hour. There were 130 hypopneas with a hypopnea index of 23. /hour. The patient also had 0 respiratory event related arousals (RERAs).      The total APNEA/HYPOPNEA INDEX (AHI) was 30.5/hour and the total RESPIRATORY DISTURBANCE INDEX was  30.5 /hour.  21 events occurred in REM sleep and 246 events in NREM. The REM AHI was  39.4 /hour, versus a non-REM AHI of 29.6. The patient spent 11 minutes of total sleep time in the supine position and 328 minutes in non-supine.. The supine AHI was 32.7 versus a non-supine AHI of 30.4.  OXYGEN SATURATION & C02:  The Wake baseline 02 saturation was 92%, with the lowest being 82%. Time spent below 89% saturation equaled 68 minutes.  PERIODIC LIMB MOVEMENTS: The patient had a total of 369 Periodic Limb Movements.  The Periodic Limb Movement (PLM) index was 65.4 and the PLM Arousal index was 7.1/hour. Audio and video analysis did not show any abnormal or unusual movements, behaviors, phonations or vocalizations. The patient took 1 bathroom break. Mild to moderate snoring was noted. The EKG was in keeping with normal sinus rhythm (NSR).  Post-study, the patient indicated that sleep was worse than usual.   IMPRESSION:  1. Obstructive Sleep Apnea (OSA) 2. Periodic Limb Movement Disorder (PLMD) 3. Dysfunctions associated with sleep stages or arousal from sleep  RECOMMENDATIONS:  1. This study demonstrates severe obstructive sleep apnea, with a  total AHI of 30.5/hour, REM AHI of 39.4/hour, supine AHI of 32.7/hour and O2 nadir of 82%. Treatment with positive airway pressure in the form of CPAP is recommended. This will require a full night titration study to optimize therapy. Other treatment options Aull be limited secondary to the severe nature of his sleep disordered breathing but [place] include, generally speaking, positional therapy by avoiding the supine sleep position, along with weight loss, upper airway or jaw surgery or  hypoglossal stimulator implantation, or the use of an oral appliance.    2. Please note that untreated obstructive sleep apnea Klemens carry additional perioperative morbidity. Patients with significant obstructive sleep apnea should receive perioperative PAP therapy and the surgeons and particularly the anesthesiologist should be informed of the diagnosis and the severity of the sleep disordered breathing. 3. Severe PLMs (periodic limb movements of sleep) were noted during this study with mild arousals; clinical correlation is recommended.  PLMs Mccreadie improve with sleep apnea treatment. 4. This study shows sleep fragmentation and abnormal sleep stage percentages; these are nonspecific findings and per se do not signify an intrinsic sleep disorder or a cause for the patient's sleep-related symptoms. Causes include (but are not limited to) the first night effect of the sleep study, circadian rhythm disturbances, medication effect or an underlying mood disorder or medical problem.  5. The patient should be cautioned not to drive, work at heights, or operate dangerous or heavy equipment when tired or sleepy. Review and reiteration of good sleep hygiene measures should be pursued with any patient. 6. The patient will be seen in follow-up in the sleep clinic at [facility] for discussion of the test results, symptom and treatment compliance review, further management strategies, etc. The referring provider will be notified of the test results.  I certify that I have reviewed the entire raw data recording prior to the issuance of this report in accordance with the Standards of Accreditation of the American Academy of Sleep Medicine (AASM)  Huston Foley, MD, PhD Diplomat, American Board of Neurology and Sleep Medicine (Neurology and Sleep Medicine)

## 2019-12-02 NOTE — Addendum Note (Signed)
Addended by: Huston Foley on: 12/02/2019 01:06 PM   Modules accepted: Orders

## 2019-12-02 NOTE — Progress Notes (Signed)
Patient referred by Dr. Tanya Nones, seen by me on 08/04/19, diagnostic PSG on 11/21/19.   Please call and notify the patient that the recent sleep study showed severe obstructive sleep apnea. I recommend treatment for this in the form of CPAP. This will require a repeat sleep study for proper titration and mask fitting and correct monitoring of the oxygen saturations. Please explain to patient. I have placed an order in the chart. Thanks.  Huston Foley, MD, PhD [facility])

## 2019-12-06 ENCOUNTER — Telehealth: Payer: Self-pay

## 2019-12-06 NOTE — Telephone Encounter (Signed)
I called pt. No answer, left a message asking pt to call me back.   

## 2019-12-06 NOTE — Telephone Encounter (Signed)
-----   Message from Huston Foley, MD sent at 12/02/2019  1:05 PM EDT ----- Patient referred by Dr. Tanya Nones, seen by me on 08/04/19, diagnostic PSG on 11/21/19.   Please call and notify the patient that the recent sleep study showed severe obstructive sleep apnea. I recommend treatment for this in the form of CPAP. This will require a repeat sleep study for proper titration and mask fitting and correct monitoring of the oxygen saturations. Please explain to patient. I have placed an order in the chart. Thanks.  Huston Foley, MD, PhD [facility])

## 2019-12-08 ENCOUNTER — Telehealth: Payer: Self-pay | Admitting: Family Medicine

## 2019-12-08 NOTE — Telephone Encounter (Signed)
CB# [id number] Call for sleep study results

## 2019-12-09 NOTE — Telephone Encounter (Signed)
Call placed to patient.   Advised that [facility] has attempted to contact him in regards to results.   Advised that patient was noted to have severe OSA and will need titration.   Advised patient to contact [facility] to schedule titration.

## 2019-12-12 NOTE — Telephone Encounter (Signed)
I called pt. No answer, left a message asking pt to call me back.   

## 2019-12-12 NOTE — Telephone Encounter (Signed)
Just called back for patient and no answer. Went straight to voicemail (again-12/06/2019). Gave patient my direct number to call me back at to schedule CPAP study.

## 2019-12-12 NOTE — Telephone Encounter (Signed)
I called patient on 9/7 but was unable to reach him. I left a message for him. I will call him back this morning.

## 2019-12-12 NOTE — Telephone Encounter (Signed)
Pt left voicemail asking for a call back re: the scheduling of the fitting for his CPAP.  Pt left voicemail at 8:13 a.m.

## 2019-12-19 ENCOUNTER — Ambulatory Visit (INDEPENDENT_AMBULATORY_CARE_PROVIDER_SITE_OTHER): Payer: Medicare Other | Admitting: Neurology

## 2019-12-19 DIAGNOSIS — G4719 Other hypersomnia: Secondary | ICD-10-CM

## 2019-12-19 DIAGNOSIS — E669 Obesity, unspecified: Secondary | ICD-10-CM

## 2019-12-19 DIAGNOSIS — G4733 Obstructive sleep apnea (adult) (pediatric): Secondary | ICD-10-CM | POA: Diagnosis not present

## 2019-12-19 DIAGNOSIS — G4720 Circadian rhythm sleep disorder, unspecified type: Secondary | ICD-10-CM

## 2019-12-19 DIAGNOSIS — G4761 Periodic limb movement disorder: Secondary | ICD-10-CM

## 2019-12-22 NOTE — Procedures (Signed)
PATIENT'S NAME:  Jose Owen, Jose J. DOB:      1940-09-18      MR#:    [id number]     DATE OF RECORDING: 12/19/2019 REFERRING M.D.:  Lynnea Ferrier, MD Study Performed:   CPAP  Titration HISTORY: 79 year old man with a history of low back pain, and mild obesity, who presents for a full night titration study to treat his obstructive sleep apnea.  His baseline sleep study, 11/21/2019 showed severe obstructive sleep apnea with a total AHI of 30.5/h, REM AHI of 39.4/h, supine AHI of 32.7/h and O2 nadir of 82%. The patient endorsed the Epworth Sleepiness Scale at 12/24 points. The patient's weight 249 pounds with a height of 74 (inches), resulting in a BMI of 32. kg/m2. The patient's neck circumference measured 18 inches.  CURRENT MEDICATIONS: Advil, Mobic, Vitamin B12  PROCEDURE:  This is a multichannel digital polysomnogram utilizing the SomnoStar 11.2 system.  Electrodes and sensors were applied and monitored per AASM Specifications.   EEG, EOG, Chin and Limb EMG, were sampled at 200 Hz.  ECG, Snore and Nasal Pressure, Thermal Airflow, Respiratory Effort, CPAP Flow and Pressure, Oximetry was sampled at 50 Hz. Digital video and audio were recorded.      CPAP was initiated per AASM standards. Patient was fitted with a large Simplus full facemask after trying a nasal mask and not tolerating it.  CPAP was initiated at a pressure of 5 cm and titrated to a final pressure of 10 cm.  On the final pressure his AHI was 0.8/h, O2 nadir 91%, with nonsupine REM sleep achieved.  Lights Out was at 22:10 and Lights On at 04:35. Total recording time (TRT) was 385.5 minutes, with a total sleep time (TST) of 333 minutes. The patient's sleep latency was 8 minutes. REM latency was 43 minutes, which is reduced. The sleep efficiency was 86.4 %.    SLEEP ARCHITECTURE: WASO (Wake after sleep onset) was 33.5 minutes with minimal to mild sleep fragmentation noted. There were 10 minutes in Stage N1, 185.5 minutes Stage N2, 80 minutes  Stage N3 and 57.5 minutes in Stage REM.  The percentage of Stage N1 was 3.%, Stage N2 was 55.7%, Stage N3 was 24.% and Stage R (REM sleep) was 17.3%. The arousals were noted as: 19 were spontaneous, 25 were associated with PLMs, 1 were associated with respiratory events.  RESPIRATORY ANALYSIS:  There was a total of 11 respiratory events: 0 obstructive apneas, 3 central apneas and 0 mixed apneas with a total of 3 apneas and an apnea index (AI) of .5 /hour. There were 8 hypopneas with a hypopnea index of 1.4/hour. The patient also had 0 respiratory event related arousals (RERAs).      The total APNEA/HYPOPNEA INDEX  (AHI) was 2. /hour and the total RESPIRATORY DISTURBANCE INDEX was 2. /hour  4 events occurred in REM sleep and 7 events in NREM. The REM AHI was 4.2 /hour versus a non-REM AHI of 1.5 /hour.  The patient spent 56 minutes of total sleep time in the supine position and 277 minutes in non-supine. The supine AHI was 2.1, versus a non-supine AHI of 1.9.  OXYGEN SATURATION & C02:  The baseline 02 saturation was 92%, with the lowest being 88%. Time spent below 89% saturation equaled 0 minutes.  PERIODIC LIMB MOVEMENTS:  The patient had a total of 297 Periodic Limb Movements. The Periodic Limb Movement (PLM) index was 53.5 and the PLM Arousal index was 4.5 /hour.  Audio and video analysis did  not show any abnormal or unusual movements, behaviors, phonations or vocalizations. The patient took no bathroom breaks. The EKG was in keeping with normal sinus rhythm (NSR).   IMPRESSION:   1. Obstructive Sleep Apnea (OSA) 2. Periodic Limb Movement Disorder (PLMD) 3. Dysfunctions associated with sleep stages or arousal from sleep   RECOMMENDATIONS:   1. This study demonstrates near-complete resolution of the patient's obstructive sleep apnea with CPAP therapy. I will, therefore, start the patient on home CPAP treatment at a pressure of 10 cm via large Simplus full face mask with (heated) humidity. The  patient will be advised to be fully compliant with PAP therapy to improve sleep related symptoms and decrease long term cardiovascular risks. The patient should be reminded, that it Cedotal take up to 3 months to get fully used to using PAP with all planned sleep. The earlier full compliance is achieved, the better long term compliance tends to be. Please note that untreated obstructive sleep apnea Geiler carry additional perioperative morbidity. Patients with significant obstructive sleep apnea should receive perioperative PAP therapy and the surgeons and particularly the anesthesiologist should be informed of the diagnosis and the severity of the sleep disordered breathing. 2. Severe PLMs (periodic limb movements of sleep) were noted during this study with minimal arousals; clinical correlation is recommended.  PLMs Go[place] improve with sleep apnea treatment. 3. This study shows some sleep fragmentation; these are nonspecific findings and per se do not signify an intrinsic sleep disorder or a cause for the patient's sleep-related symptoms. Causes include (but are not limited to) the first night effect of the sleep study, circadian rhythm disturbances, medication effect or an underlying mood disorder or medical problem.  4. The patient should be cautioned not to drive, work at heights, or operate dangerous or heavy equipment when tired or sleepy. Review and reiteration of good sleep hygiene measures should be pursued with any patient. 5. The patient will be seen in follow-up in the sleep clinic at [facility] for discussion of the test results, symptom and treatment compliance review, further management strategies, etc. The referring provider will be notified of the test results.   I certify that I have reviewed the entire raw data recording prior to the issuance of this report in accordance with the Standards of Accreditation of the American Academy of Sleep Medicine (AASM)   Huston Foley, MD, PhD Diplomat, American Board of  Neurology and Sleep Medicine (Neurology and Sleep Medicine)

## 2019-12-22 NOTE — Addendum Note (Signed)
Addended by: Huston Foley on: 12/22/2019 06:27 PM   Modules accepted: Orders

## 2019-12-22 NOTE — Progress Notes (Signed)
Patient referred by Dr. Tanya Nones, seen by me on 08/04/19, diagnostic PSG on 11/21/19.  Patient had a CPAP titration study on 12/19/19.  Please call and inform patient that I have entered an order for treatment with positive airway pressure (PAP) treatment for obstructive sleep apnea (OSA). He did well during the latest sleep study with CPAP. We will, therefore, arrange for a machine for home use through a DME (durable medical equipment) company of His choice; and I will see the patient back in follow-up in about 10 weeks. Please also explain to the patient that I will be looking out for compliance data, which can be downloaded from the machine (stored on an SD card, that is inserted in the machine) or via remote access through a modem, that is built into the machine. At the time of the followup appointment we will discuss sleep study results and how it is going with PAP treatment at home. Please advise patient to bring His machine at the time of the first FU visit, even though this is cumbersome. Bringing the machine for every visit after that will likely not be needed, but often helps for the first visit to troubleshoot if needed. Please re-enforce the importance of compliance with treatment and the need for [place] to monitor compliance data - often an insurance requirement and actually good feedback for the patient as far as how they are doing.  Also remind patient, that any interim PAP machine or mask issues should be first addressed with the DME company, as they can often help better with technical and mask fit issues. Please ask if patient has a preference regarding DME company.  Please also make sure, the patient has a follow-up appointment with me in about 10 weeks from the setup date, thanks. Repinski see one of our nurse practitioners if needed for proper timing of the FU appointment.  Please fax or rout report to the referring provider. Thanks,   Huston Foley, MD, PhD [facility])

## 2019-12-27 ENCOUNTER — Telehealth: Payer: Self-pay

## 2019-12-27 NOTE — Telephone Encounter (Signed)
-----   Message from Huston Foley, MD sent at 12/22/2019  6:27 PM EDT ----- Patient referred by Dr. Tanya Nones, seen by me on 08/04/19, diagnostic PSG on 11/21/19.  Patient had a CPAP titration study on 12/19/19.  Please call and inform patient that I have entered an order for treatment with positive airway pressure (PAP) treatment for obstructive sleep apnea (OSA). He did well during the latest sleep study with CPAP. We will, therefore, arrange for a machine for home use through a DME (durable medical equipment) company of His choice; and I will see the patient back in follow-up in about 10 weeks. Please also explain to the patient that I will be looking out for compliance data, which can be downloaded from the machine (stored on an SD card, that is inserted in the machine) or via remote access through a modem, that is built into the machine. At the time of the followup appointment we will discuss sleep study results and how it is going with PAP treatment at home. Please advise patient to bring His machine at the time of the first FU visit, even though this is cumbersome. Bringing the machine for every visit after that will likely not be needed, but often helps for the first visit to troubleshoot if needed. Please re-enforce the importance of compliance with treatment and the need for [place] to monitor compliance data - often an insurance requirement and actually good feedback for the patient as far as how they are doing.  Also remind patient, that any interim PAP machine or mask issues should be first addressed with the DME company, as they can often help better with technical and mask fit issues. Please ask if patient has a preference regarding DME company.  Please also make sure, the patient has a follow-up appointment with me in about 10 weeks from the setup date, thanks. Gracy see one of our nurse practitioners if needed for proper timing of the FU appointment.  Please fax or rout report to the referring provider.  Thanks,   Huston Foley, MD, PhD [facility])

## 2019-12-27 NOTE — Telephone Encounter (Signed)
I called pt. No answer, left a message asking pt to call me back.   

## 2019-12-29 NOTE — Telephone Encounter (Signed)
I called pt. No answer, left a message asking pt to call me back.   

## 2019-12-29 NOTE — Telephone Encounter (Signed)
I have tried to reach the pt twice and vm were left. Will send letter asking for the pt to call the office as well. Will wait to hear back from the pt.

## 2019-12-29 NOTE — Telephone Encounter (Signed)
Pt called me back and we discussed sleep study. Pt is agreeable to starting CPAP. Order has been sent to [facility]. Pap compliance reviewed. Letter sent to the pt stating f/u and [facility]'s #. Pt is to cal back in 1 week if he has not heard from [facility].

## 2020-01-09 ENCOUNTER — Telehealth: Payer: Self-pay | Admitting: Neurology

## 2020-01-09 NOTE — Telephone Encounter (Signed)
Pt called, want an update on [facility]. Inform Pt nurse has reached out to [facility]. Pt ask to have the nurse to call him.

## 2020-01-09 NOTE — Telephone Encounter (Signed)
Pt called, got fitted for mask and adjust set and sending to [facility]. Received a letter, mask would be sent on 10/10. Called [facility], [facility] said have not received any information on me. Would like a call from the nurse as soon as possible.

## 2020-01-09 NOTE — Telephone Encounter (Signed)
I have reached out to [facility] about this.  

## 2020-01-09 NOTE — Telephone Encounter (Signed)
Pt notified via vm I have reached out to [facility] and I am waiting on a call back.  Pt was advised I would call back once I know more from [facility].  I have also asked [facility] to call the pt directly.

## 2020-01-19 ENCOUNTER — Telehealth: Payer: Self-pay | Admitting: Neurology

## 2020-01-19 NOTE — Telephone Encounter (Signed)
Pt called and LVM wanting to speak to someone regarding his Cpap unit. Please advise.

## 2020-01-20 NOTE — Telephone Encounter (Signed)
Pt called, [facility] had sent me a message on 10/11, my CPAP would be ready 5-7 business days. Have not heard anything from them. Would like a call from the nurse.

## 2020-01-23 NOTE — Telephone Encounter (Signed)
Received the following message from Gala Lewandowsky with DME company: "We had been waiting on auth from his insurance. Will reach out."

## 2020-01-23 NOTE — Telephone Encounter (Signed)
Sent community message to [facility] to provide patient with an update. Waiting on response.

## 2020-01-26 ENCOUNTER — Telehealth: Payer: Self-pay

## 2020-01-26 NOTE — Telephone Encounter (Signed)
Pt called to ask about CPAP.  Pt stated he has not been contacted by any DME company but that his insurance sent him an approval for 90 days.

## 2020-01-31 NOTE — Telephone Encounter (Signed)
Reached out to DME by phone and email, waiting on a reply

## 2020-03-06 ENCOUNTER — Telehealth: Payer: Self-pay | Admitting: Neurology

## 2020-03-06 NOTE — Telephone Encounter (Signed)
Pt has asked that a message be sent to RN re: the status of his CPAP mask, please call pt to discuss.  Pt states [place] and would really like to get the mask before leaving.

## 2020-03-06 NOTE — Telephone Encounter (Signed)
I called pt to discuss. No answer, left a message asking him to call me back.  If pt calls back please remind him that there is a Sport and exercise psychologist of cpaps. DMEs are not getting as much inventory as they were pre-pandemic. It is taking them up to 12 weeks sometimes to get a cpap issued to pts. I have reached out to [facility] to discuss this further. Pt's December appt will need to be pushed out since pt has not started cpap yet.

## 2020-03-06 NOTE — Telephone Encounter (Signed)
[facility] informed me that pt has been scheduled fdor 03/16/2020 for his cpap pick up.

## 2020-03-12 NOTE — Telephone Encounter (Signed)
Pt's appt on 03/13/20 with Dr. Frances Furbish shouldbe rescheduled since he has not started his cpap yet. He should be picking up his machine on 03/16/20 and therefore should follow up after 04/17/20.  I called pt. No answer, left a message asking him to call me back. If pt calls back please assist him with this.

## 2020-03-13 ENCOUNTER — Encounter: Payer: Self-pay | Admitting: Neurology

## 2020-03-13 ENCOUNTER — Ambulatory Visit: Payer: Self-pay | Admitting: Neurology

## 2020-03-15 DIAGNOSIS — G4733 Obstructive sleep apnea (adult) (pediatric): Secondary | ICD-10-CM | POA: Diagnosis not present

## 2020-03-19 ENCOUNTER — Other Ambulatory Visit: Payer: Self-pay | Admitting: Family Medicine

## 2020-03-22 DIAGNOSIS — Z20822 Contact with and (suspected) exposure to covid-19: Secondary | ICD-10-CM | POA: Diagnosis not present

## 2020-03-22 DIAGNOSIS — J069 Acute upper respiratory infection, unspecified: Secondary | ICD-10-CM | POA: Diagnosis not present

## 2020-03-25 DIAGNOSIS — J019 Acute sinusitis, unspecified: Secondary | ICD-10-CM | POA: Diagnosis not present

## 2020-03-25 DIAGNOSIS — J209 Acute bronchitis, unspecified: Secondary | ICD-10-CM | POA: Diagnosis not present

## 2020-03-29 ENCOUNTER — Telehealth: Payer: Self-pay | Admitting: Family Medicine

## 2020-03-29 ENCOUNTER — Other Ambulatory Visit: Payer: Self-pay | Admitting: Family Medicine

## 2020-03-29 MED ORDER — HYDROCODONE-HOMATROPINE 5-1.5 MG/5ML PO SYRP: 5.0000 mL | ORAL_SOLUTION | Freq: Three times a day (TID) | ORAL | 0 refills | Status: DC | PRN

## 2020-03-29 NOTE — Telephone Encounter (Signed)
Spoke with pt, he was informed that the temp on his cpap was initially adjusted too high. Has now been adjust from 80 to 74. While sleeping with the levels on 80, caused drying of the throat and coughing. Tested for covid, neg. Went to urgent care 2x, given antibiotic and inhaler, temporary relief. Taking mucinex am//pm, not working. Had lungs chked, all clear, told he has bronchitis. Asking for med for cough. What should I send to pharmacy?

## 2020-03-29 NOTE — Telephone Encounter (Signed)
Having issues with his CPAP on going cough   please advise

## 2020-03-29 NOTE — Telephone Encounter (Signed)
I will send in hycodan.  

## 2020-04-02 NOTE — Telephone Encounter (Signed)
I called pt to discuss. No answer, left a message asking him to call me back. 

## 2020-04-02 NOTE — Telephone Encounter (Signed)
Pt left a voicemail asking for a call to discuss his CPAP mask and a letter that he received.

## 2020-04-04 NOTE — Telephone Encounter (Signed)
I called pt to discuss. No answer, left a message asking him to call me back. 

## 2020-04-06 NOTE — Telephone Encounter (Signed)
I called pt again to discuss. No answer, left a message asking him to call me back. This is my third unsuccessful attempt at reaching pt via phone. I have already sent him a mychart message asking him to return my call.

## 2020-04-15 DIAGNOSIS — G4733 Obstructive sleep apnea (adult) (pediatric): Secondary | ICD-10-CM | POA: Diagnosis not present

## 2020-05-08 ENCOUNTER — Telehealth: Payer: Self-pay

## 2020-05-08 NOTE — Telephone Encounter (Signed)
Patient called and left voicemail on sleep labs answering machine today.  Patient is wanting to schedule his initial CPAP appointment with one of our providers.  I called the patient and left a voicemail asking him to call back for this appointment to be scheduled.  Her [facility] patient was started on his machine 03/15/2020.  Patient needs to be seen within 31 to 90 days of starting his machine for insurance purposes.  If patient calls back please help schedule him for a 31 to 90-day follow-up from the start date.  This appointment can be scheduled with Dr. Frances Furbish or one of the nurse practitioners

## 2020-05-08 NOTE — Telephone Encounter (Signed)
Pt has left a voicemail stating that he wants to schedule his initial cpap follow up with MD.

## 2020-05-10 ENCOUNTER — Ambulatory Visit: Payer: Medicare Other | Admitting: Neurology

## 2020-05-10 ENCOUNTER — Encounter: Payer: Self-pay | Admitting: Neurology

## 2020-05-10 ENCOUNTER — Other Ambulatory Visit: Payer: Self-pay

## 2020-05-10 VITALS — BP 122/76 | HR 78 | Ht 74.0 in | Wt 256.1 lb

## 2020-05-10 DIAGNOSIS — Z789 Other specified health status: Secondary | ICD-10-CM | POA: Diagnosis not present

## 2020-05-10 DIAGNOSIS — Z9989 Dependence on other enabling machines and devices: Secondary | ICD-10-CM | POA: Diagnosis not present

## 2020-05-10 DIAGNOSIS — G4733 Obstructive sleep apnea (adult) (pediatric): Secondary | ICD-10-CM

## 2020-05-10 NOTE — Patient Instructions (Addendum)
It was nice to see you again today.  I am glad to hear that you have benefited some already from using your CPAP and hopefully will reap more benefit in the near future, once you are able to use it all night, every night.    I appreciate that you are working on using your CPAP consistently.  I hope that we can help you get more tolerant to it and help you with the moisture issue.  Mouth dryness can certainly be one of the bigger problems with using CPAP but I do commend you for using it as consistently as you have.  You are not quite there yet when it comes to insurance compliance criteria but well on your way.  Please call us in about a month so we can review another 30-day download to make sure you are in compliance with using your CPAP with respect to the insurance compliance criteria.  Please follow-up routinely in this clinic to see one of our nurse practitioners in 3 months and then hopefully infrequently so long as you are doing well and feels stable.  Please continue using your CPAP regularly. While your insurance requires that you use CPAP at least 4 hours each night on 70% of the nights, I recommend, that you not skip any nights and use it throughout the night if you can. Getting used to CPAP and staying with the treatment long term does take time and patience and discipline. Untreated obstructive sleep apnea when it is moderate to severe can have an adverse impact on cardiovascular health and raise her risk for heart disease, arrhythmias, hypertension, congestive heart failure, stroke and diabetes. Untreated obstructive sleep apnea causes sleep disruption, nonrestorative sleep, and sleep deprivation. This can have an impact on your day to day functioning and cause daytime sleepiness and impairment of cognitive function, memory loss, mood disturbance, and problems focussing. Using CPAP regularly can improve these symptoms.

## 2020-05-10 NOTE — Progress Notes (Signed)
Subjective:    Patient ID: Jose Owen is a 80 y.o. male.  HPI     Interim history:   Jose Owen is a 80 year old right-handed gentleman with an underlying medical history of low back pain, and mild obesity, who presents for follow-up consultation of his obstructive sleep apnea after interim testing and starting CPAP therapy.  The patient is unaccompanied today.  I first met him at the request of his primary care physician on 08/04/2019, at which time he reported snoring and excessive daytime somnolence.  He also had a history of low testosterone.  He was advised to proceed with sleep testing.  He had a baseline sleep study followed by a CPAP titration study.  Baseline sleep study from 11/21/2019 showed a sleep efficiency of 82.5%, sleep latency 6 minutes, REM latency 67 minutes.  He had an increased percentage of stage II sleep, and a reduced percentage of REM sleep at 9.5%.  He had 29 obstructive and 13 central apneas, 130 hypopneas for the night.  Total AHI was 30.5/h, REM AHI 39.4/h, supine AHI 32.7/h and O2 nadir 82%.  He was advised to proceed with a full night CPAP titration study, which he had on 12/19/2019. Sleep latency was 8 minutes, REM latency 43 minutes, sleep efficiency 86.4%, he had a normal percentage of stage I and stage II sleep, and increased percentage of slow-wave sleep and a near normal percentage of REM sleep at 17.3%.  He was fitted with a large full facemask after trying a nasal mask.  CPAP was titrated from 5 cm to 10 cm.  On the final pressure his AHI was 0.8/h, with nonsupine REM sleep achieved and O2 nadir of 91%.  Based on the test results I prescribed CPAP therapy for home use.  Set up date was 03/15/2020.   Today, 05/10/20: I reviewed his CPAP compliance data from 04/09/2020 through 05/12/2020, which is a total of 30 days, during which time he used his machine 27 days with percent use days greater than 4 hours at 57%, indicating mildly suboptimal compliance, average usage of 4  hours and 34 minutes, residual AHI at goal at 1.8/h, leak on the low side with a 95th percentile at 3.7 L/min, pressure of 10 cm with EPR of 3.  In the month of early January through early February 20 04/05/2020 and 05/04/2020 his percent use days greater than 4 hours was slightly better at 60%, still suboptimal.  He reports overall doing fairly well but he does have significant issues with mouth dryness.  He called his DME provider at least twice, the temperature was first turned down to 74 degrees and then to room air but he still wakes up with severely dry mouth.  He has to sip water in the middle of the night and sometimes he just gets too frustrated and does not put it back on.  He did go to [place] for his grandsons wedding and missed 2 days.  He is willing to continue with treatment and actually has already noticed some benefit and that his nocturia is better and daytime energy are better.  He is motivated to continue with treatment and working on compliance.  He is using a large Simplus full facemask without any problems.  Chinstrap was given to him but it does not fit and he does not actually need it.   The patient's allergies, current medications, family history, past medical history, past social history, past surgical history and problem list were reviewed and updated as  appropriate.    Previously:   08/03/21: (He) reports snoring and excessive daytime somnolence and prior history of low testosterone. I reviewed your office note from 07/22/2019. He had lab work at the time which showed benign results including lipid panel, TSH, B12, testosterone levels. His Epworth sleepiness score is 12 out of 24, fatigue severity score is 40 out of 63.  He lives alone, sadly, lost his wife in January 2021.  He is holding up okay, but reports that he has his moments.  He has 2 kids, daughter in [place] and son in [place].  He stays connected mostly over his iPhone with his kids and grandkids.  He does not drink  caffeine daily, he does not drink alcohol, he quit smoking about 50 years ago.  He worked in [employer] for over 43 years.  He does get tired very easily during the day, tries not to take a nap.  He tries to stay busy.  He has had some weight fluctuation.  He has no pets in the household.  He denies night to night nocturia or recurrent morning headaches.  His snoring was mild as he recalls from what his wife used to tell him.  His sleep is interrupted at night, he wakes up 3-4 times per night without apparent reason.  His Past Medical History Is Significant For: Past Medical History:  Diagnosis Date  . BPH (benign prostatic hyperplasia)     His Past Surgical History Is Significant For: No past surgical history on file.  His Family History Is Significant For: No family history on file.  His Social History Is Significant For: Social History   Socioeconomic History  . Marital status: Married    Spouse name: Not on file  . Number of children: Not on file  . Years of education: Not on file  . Highest education level: Not on file  Occupational History  . Not on file  Tobacco Use  . Smoking status: Former Smoker    Types: Cigarettes  . Smokeless tobacco: Never Used  Substance and Sexual Activity  . Alcohol use: No  . Drug use: No  . Sexual activity: Not on file  Other Topics Concern  . Not on file  Social History Narrative  . Not on file   Social Determinants of Health   Financial Resource Strain: Not on file  Food Insecurity: Not on file  Transportation Needs: Not on file  Physical Activity: Not on file  Stress: Not on file  Social Connections: Not on file    His Allergies Are:  No Known Allergies:   His Current Medications Are:  Outpatient Encounter Medications as of 05/10/2020  Medication Sig  . Acetaminophen (TYLENOL PO) Take by mouth.  . tamsulosin (FLOMAX) 0.4 MG CAPS capsule TAKE 1 CAPSULE BY MOUTH EVERY DAY  . vitamin B-12 (CYANOCOBALAMIN) 1000 MCG tablet  Take 1 tablet (1,000 mcg total) by mouth daily.  . [DISCONTINUED] HYDROcodone-homatropine (HYCODAN) 5-1.5 MG/5ML syrup Take 5 mLs by mouth every 8 (eight) hours as needed for cough. (Patient not taking: Reported on 05/10/2020)  . [DISCONTINUED] meloxicam (MOBIC) 15 MG tablet Take 0.5-1 tablets (7.5-15 mg total) by mouth daily as needed for pain. (Patient not taking: Reported on 05/10/2020)   No facility-administered encounter medications on file as of 05/10/2020.  :  Review of Systems:  Out of a complete 14 point review of systems, all are reviewed and negative with the exception of these symptoms as listed below: Review of Systems  Neurological:  Here for f/u on cpap. Reports he is having trouble with his throat staying dry while using the machine. Pt reports dryness gets unbearable at times and he has to take the machine off.     Objective:  Neurological Exam  Physical Exam Physical Examination:   Vitals:   05/10/20 1036  BP: 122/76  Pulse: 78  SpO2: 96%    General Examination: The patient is a very pleasant 80 y.o. male in no acute distress. He appears well-developed and well-nourished and well groomed.   HEENT: Normocephalic, atraumatic, pupils are equal, round and reactive to light, extraocular tracking is good without limitation to gaze excursion or nystagmus noted. Hearing is grossly intact. Face is symmetric with normal facial animation. Speech is clear with no dysarthria noted. There is no hypophonia. There is no lip, neck/head, jaw or voice tremor. Neck is supple with full range of passive and active motion. There are no carotid bruits on auscultation. Oropharynx exam reveals: mild mouth dryness, adequate dental hygiene and moderate airway crowding.  Tongue protrudes centrally in palate elevates symmetrically.  Chest: Clear to auscultation without wheezing, rhonchi or crackles noted.  Heart: S1+S2+0, regular and normal without murmurs, rubs or gallops noted.    Abdomen: Soft, non-tender and non-distended with normal bowel sounds appreciated on auscultation.  Extremities: There is no pitting edema in the distal lower extremities bilaterally.   Skin: Warm and dry without trophic changes noted.   Musculoskeletal: exam reveals no obvious joint deformities, tenderness or joint swelling or erythema.   Neurologically:  Mental status: The patient is awake, alert and oriented in all 4 spheres. His immediate and remote memory, attention, language skills and fund of knowledge are appropriate. There is no evidence of aphasia, agnosia, apraxia or anomia. Speech is clear with normal prosody and enunciation. Thought process is linear. Mood is normal and affect is normal.  Cranial nerves II - XII are as described above under HEENT exam.  Motor exam: Normal bulk, strength and tone is noted. There is no tremor, fine motor skills and coordination: grossly intact.  Cerebellar testing: No dysmetria or intention tremor. There is no truncal or gait ataxia.  Sensory exam: intact to light touch in the upper and lower extremities.  Gait, station and balance: He stands easily. No veering to one side is noted. No leaning to one side is noted. Posture is age-appropriate and stance is narrow based. Gait shows normal stride length and normal pace. No problems turning.    Assessment and Plan:  In summary, Jose Owen is a very pleasant 80 year old male  with an underlying medical history of low back pain, and mild obesity, who presents for follow-up consultation of his obstructive sleep apnea after interim testing and starting CPAP therapy.  His baseline sleep study from 11/21/2019 showed severe obstructive sleep apnea with a total AHI of 30.5/h, O2 nadir of 82%.  He had a subsequent titration study on 12/19/2019.  He established CPAP therapy following 03/15/2020 and has been generally fairly compliant.  He has benefited from treatment and that his sleep consolidation is better,  nocturia improved and daytime energy a little better.  He is struggling with significant mouth dryness and difficulty tolerating the air throughout the night.  Patient is very motivated to continue with treatment and we provided some additional help and education regarding the moisture setting of his machine.  He is using a full facemask successfully but does report opening his mouth at night but leak is very low from  the mask.  His apnea score is very good.  He is not quite there yet when it comes to compliance by insurance criteria.  He is advised to call our office in about a month so we can review another 30-day download from his machine remotely.  He is advised to follow-up in the clinic to see one of our nurse practitioners in 3 months and hopefully if he does well at the time and feels stable, we can space out his appointments even to once a year.  He is commended on his treatment adherence thus far and advised to call us with any interim questions or concerns that [place] arise.  I answered all his questions today and he was in agreement with the plan.   I spent 30 minutes in total face-to-face time and in reviewing records during pre-charting, more than 50% of which was spent in counseling and coordination of care, reviewing test results, reviewing medications and treatment regimen and/or in discussing or reviewing the diagnosis of OSA, the prognosis and treatment options. Pertinent laboratory and imaging test results that were available during this visit with the patient were reviewed by me and considered in my medical decision making (see chart for details).

## 2020-05-10 NOTE — Addendum Note (Signed)
Addended by: Ann Maki on: 05/10/2020 02:13 PM   Modules accepted: Orders

## 2020-05-30 ENCOUNTER — Telehealth: Payer: Self-pay | Admitting: Neurology

## 2020-05-30 NOTE — Telephone Encounter (Signed)
Darl Pikes from [facility] states that they have rented CPAP machine for 3 months & pt. only uses it about 28% of the time. She states in order for them to purchase machine, pt. would need to use it 70% or more of the time.   Best contact: [phone]

## 2020-05-30 NOTE — Telephone Encounter (Signed)
Per Dr. Frances Furbish from 05/10/20   appreciate that you are working on using your CPAP consistently.  I hope that we can help you get more tolerant to it and help you with the moisture issue.  Mouth dryness can certainly be one of the bigger problems with using CPAP but I do commend you for using it as consistently as you have.  You are not quite there yet when it comes to insurance compliance criteria but well on your way.  Please call us in about a month so we can review another 30-day download to make sure you are in compliance with using your CPAP with respect to the insurance compliance criteria.    We will review data in 30 days and see where pt compliance stands.

## 2020-06-13 DIAGNOSIS — G4733 Obstructive sleep apnea (adult) (pediatric): Secondary | ICD-10-CM | POA: Diagnosis not present

## 2020-06-14 DIAGNOSIS — G4733 Obstructive sleep apnea (adult) (pediatric): Secondary | ICD-10-CM | POA: Diagnosis not present

## 2020-07-10 ENCOUNTER — Telehealth: Payer: Self-pay | Admitting: Neurology

## 2020-07-10 NOTE — Telephone Encounter (Signed)
I called pt. No answer, left a message asking pt to call me back.   

## 2020-07-10 NOTE — Telephone Encounter (Signed)
Pt called, checking on report for my CPAP machine. Want to make sure I am using the machine like I'm suppose to. Would like a call from the nurse.

## 2020-07-11 NOTE — Telephone Encounter (Addendum)
Pt called back and advised he wanted to check in to see how his last 30 day report looked. I advised the last 30 day report showed 30/30 days of use and 30/30 >4 hours. Other data is listed on the resmed report. Pt reports he is feeling well and will continue as is. Pt will also keep f/u as scheduled in [place].

## 2020-07-11 NOTE — Telephone Encounter (Signed)
I called pt. No answer, left a message asking pt to call me back.   

## 2020-07-11 NOTE — Telephone Encounter (Signed)
Noted, thank you, compliance data looks good.  We will follow-up as scheduled.

## 2020-07-12 ENCOUNTER — Other Ambulatory Visit: Payer: Self-pay

## 2020-07-12 MED ORDER — TAMSULOSIN HCL 0.4 MG PO CAPS
0.4000 mg | ORAL_CAPSULE | Freq: Every day | ORAL | 3 refills | Status: DC
Start: 2020-07-12 — End: 2020-10-15

## 2020-08-21 NOTE — Progress Notes (Deleted)
PATIENT: Jose Owen DOB: 11/14/1940  REASON FOR VISIT: follow up HISTORY FROM: patient  No chief complaint on file.    HISTORY OF PRESENT ILLNESS: 08/21/20 ALL:  Jose Owen is a 80 y.o. male here today for follow up for OSA on CPAP.      HISTORY: (copied from Dr Guadelupe Sabin previous note)  Mr. Jose Owen is a 80 year old right-handed gentleman with an underlying medical history of low back pain, and mild obesity, who presents for follow-up consultation of his obstructive sleep apnea after interim testing and starting CPAP therapy.  The patient is unaccompanied today.  I first met him at the request of his primary care physician on 08/04/2019, at which time he reported snoring and excessive daytime somnolence.  He also had a history of low testosterone.  He was advised to proceed with sleep testing.  He had a baseline sleep study followed by a CPAP titration study.  Baseline sleep study from 11/21/2019 showed a sleep efficiency of 82.5%, sleep latency 6 minutes, REM latency 67 minutes.  He had an increased percentage of stage II sleep, and a reduced percentage of REM sleep at 9.5%.  He had 29 obstructive and 13 central apneas, 130 hypopneas for the night.  Total AHI was 30.5/h, REM AHI 39.4/h, supine AHI 32.7/h and O2 nadir 82%.  He was advised to proceed with a full night CPAP titration study, which he had on 12/19/2019. Sleep latency was 8 minutes, REM latency 43 minutes, sleep efficiency 86.4%, he had a normal percentage of stage I and stage II sleep, and increased percentage of slow-wave sleep and a near normal percentage of REM sleep at 17.3%.  He was fitted with a large full facemask after trying a nasal mask.  CPAP was titrated from 5 cm to 10 cm.  On the final pressure his AHI was 0.8/h, with nonsupine REM sleep achieved and O2 nadir of 91%.  Based on the test results I prescribed CPAP therapy for home use.  Set up date was 03/15/2020.   Today, 05/10/20: I reviewed his CPAP compliance data from  04/09/2020 through 05/12/2020, which is a total of 30 days, during which time he used his machine 27 days with percent use days greater than 4 hours at 57%, indicating mildly suboptimal compliance, average usage of 4 hours and 34 minutes, residual AHI at goal at 1.8/h, leak on the low side with a 95th percentile at 3.7 L/min, pressure of 10 cm with EPR of 3.  In the month of early January through early February 20 04/05/2020 and 05/04/2020 his percent use days greater than 4 hours was slightly better at 60%, still suboptimal.  He reports overall doing fairly well but he does have significant issues with mouth dryness.  He called his DME provider at least twice, the temperature was first turned down to 74 degrees and then to room air but he still wakes up with severely dry mouth.  He has to sip water in the middle of the night and sometimes he just gets too frustrated and does not put it back on.  He did go to [place] for his grandsons wedding and missed 2 days.  He is willing to continue with treatment and actually has already noticed some benefit and that his nocturia is better and daytime energy are better.  He is motivated to continue with treatment and working on compliance.  He is using a large Simplus full facemask without any problems.  Chinstrap was given to  him but it does not fit and he does not actually need it.   The patient's allergies, current medications, family history, past medical history, past social history, past surgical history and problem list were reviewed and updated as appropriate.    REVIEW OF SYSTEMS: Out of a complete 14 system review of symptoms, the patient complains only of the following symptoms, and all other reviewed systems are negative.  ESS: FSS:   ALLERGIES: No Known Allergies  HOME MEDICATIONS: Outpatient Medications Prior to Visit  Medication Sig Dispense Refill  . Acetaminophen (TYLENOL PO) Take by mouth.    . tamsulosin (FLOMAX) 0.4 MG CAPS capsule Take 1  capsule (0.4 mg total) by mouth daily. 30 capsule 3  . vitamin B-12 (CYANOCOBALAMIN) 1000 MCG tablet Take 1 tablet (1,000 mcg total) by mouth daily.     No facility-administered medications prior to visit.    PAST MEDICAL HISTORY: Past Medical History:  Diagnosis Date  . BPH (benign prostatic hyperplasia)     PAST SURGICAL HISTORY: No past surgical history on file.  FAMILY HISTORY: No family history on file.  SOCIAL HISTORY: Social History   Socioeconomic History  . Marital status: Married    Spouse name: Not on file  . Number of children: Not on file  . Years of education: Not on file  . Highest education level: Not on file  Occupational History  . Not on file  Tobacco Use  . Smoking status: Former Smoker    Types: Cigarettes  . Smokeless tobacco: Never Used  Substance and Sexual Activity  . Alcohol use: No  . Drug use: No  . Sexual activity: Not on file  Other Topics Concern  . Not on file  Social History Narrative  . Not on file   Social Determinants of Health   Financial Resource Strain: Not on file  Food Insecurity: Not on file  Transportation Needs: Not on file  Physical Activity: Not on file  Stress: Not on file  Social Connections: Not on file  Intimate Partner Violence: Not on file     PHYSICAL EXAM  There were no vitals filed for this visit. There is no height or weight on file to calculate BMI.  Generalized: Well developed, in no acute distress  Cardiology: normal rate and rhythm, no murmur noted Respiratory: clear to auscultation bilaterally  Neurological examination  Mentation: Alert oriented to time, place, history taking. Follows all commands speech and language fluent Cranial nerve II-XII: Pupils were equal round reactive to light. Extraocular movements were full, visual field were full  Motor: The motor testing reveals 5 over 5 strength of all 4 extremities. Good symmetric motor tone is noted throughout.  Gait and station: Gait is  normal.    DIAGNOSTIC DATA (LABS, IMAGING, TESTING) - I reviewed patient records, labs, notes, testing and imaging myself where available.  No flowsheet data found.   Lab Results  Component Value Date   WBC 4.7 07/22/2019   HGB 15.6 07/22/2019   HCT 46.0 07/22/2019   MCV 93.5 07/22/2019   PLT 205 07/22/2019      Component Value Date/Time   NA 141 07/22/2019 0938   K 4.0 07/22/2019 0938   CL 106 07/22/2019 0938   CO2 26 07/22/2019 0938   GLUCOSE 105 (H) 07/22/2019 0938   BUN 13 07/22/2019 0938   CREATININE 0.92 07/22/2019 0938   CALCIUM 9.5 07/22/2019 0938   PROT 6.3 07/22/2019 0938   AST 22 07/22/2019 0938   ALT 18 07/22/2019  2376   BILITOT 0.9 07/22/2019 0938   GFRNONAA 81 07/22/2019 0938   GFRAA 93 07/22/2019 0938   Lab Results  Component Value Date   CHOL 164 07/22/2019   HDL 39 (L) 07/22/2019   LDLCALC 104 (H) 07/22/2019   TRIG 118 07/22/2019   CHOLHDL 4.2 07/22/2019   No results found for: HGBA1C Lab Results  Component Value Date   EGBTDVVO16 073 07/22/2019   Lab Results  Component Value Date   TSH 2.02 07/22/2019     ASSESSMENT AND PLAN 80 y.o. year old male  has a past medical history of BPH (benign prostatic hyperplasia). here with   No diagnosis found.    Palmer Fahrner Bouyer is doing well on CPAP therapy. Compliance report reveals ***. *** was encouraged to continue using CPAP nightly and for greater than 4 hours each night. We will update supply orders as indicated. Risks of untreated sleep apnea review and education materials provided. Healthy lifestyle habits encouraged. *** will follow up in ***, sooner if needed. *** verbalizes understanding and agreement with this plan.    No orders of the defined types were placed in this encounter.    No orders of the defined types were placed in this encounter.     I spent 15 minutes with the patient. 50% of this time was spent counseling and educating patient on plan of care and medications.    Debbora Presto, FNP-C 08/21/2020, 4:34 PM [facility] [address], [place], [place] [address] [phone]

## 2020-08-21 NOTE — Patient Instructions (Incomplete)
Please continue using your CPAP regularly. While your insurance requires that you use CPAP at least 4 hours each night on 70% of the nights, I recommend, that you not skip any nights and use it throughout the night if you can. Getting used to CPAP and staying with the treatment long term does take time and patience and discipline. Untreated obstructive sleep apnea when it is moderate to severe can have an adverse impact on cardiovascular health and raise her risk for heart disease, arrhythmias, hypertension, congestive heart failure, stroke and diabetes. Untreated obstructive sleep apnea causes sleep disruption, nonrestorative sleep, and sleep deprivation. This can have an impact on your day to day functioning and cause daytime sleepiness and impairment of cognitive function, memory loss, mood disturbance, and problems focussing. Using CPAP regularly can improve these symptoms.     Sleep Apnea Sleep apnea affects breathing during sleep. It causes breathing to stop for a short time or to become shallow. It can also increase the risk of:  Heart attack.  Stroke.  Being very overweight (obese).  Diabetes.  Heart failure.  Irregular heartbeat. The goal of treatment is to help you breathe normally again. What are the causes? There are three kinds of sleep apnea:  Obstructive sleep apnea. This is caused by a blocked or collapsed airway.  Central sleep apnea. This happens when the brain does not send the right signals to the muscles that control breathing.  Mixed sleep apnea. This is a combination of obstructive and central sleep apnea. The most common cause of this condition is a collapsed or blocked airway. This can happen if:  Your throat muscles are too relaxed.  Your tongue and tonsils are too large.  You are overweight.  Your airway is too small.   What increases the risk?  Being overweight.  Smoking.  Having a small airway.  Being older.  Being male.  Drinking  alcohol.  Taking medicines to calm yourself (sedatives or tranquilizers).  Having family members with the condition. What are the signs or symptoms?  Trouble staying asleep.  Being sleepy or tired during the day.  Getting angry a lot.  Loud snoring.  Headaches in the morning.  Not being able to focus your mind (concentrate).  Forgetting things.  Less interest in sex.  Mood swings.  Personality changes.  Feelings of sadness (depression).  Waking up a lot during the night to pee (urinate).  Dry mouth.  Sore throat. How is this diagnosed?  Your medical history.  A physical exam.  A test that is done when you are sleeping (sleep study). The test is most often done in a sleep lab but Dejager also be done at home. How is this treated?  Sleeping on your side.  Using a medicine to get rid of mucus in your nose (decongestant).  Avoiding the use of alcohol, medicines to help you relax, or certain pain medicines (narcotics).  Losing weight, if needed.  Changing your diet.  Not smoking.  Using a machine to open your airway while you sleep, such as: ? An oral appliance. This is a mouthpiece that shifts your lower jaw forward. ? A CPAP device. This device blows air through a mask when you breathe out (exhale). ? An EPAP device. This has valves that you put in each nostril. ? A BPAP device. This device blows air through a mask when you breathe in (inhale) and breathe out.  Having surgery if other treatments do not work. It is important to get treatment   for sleep apnea. Without treatment, it can lead to:  High blood pressure.  Coronary artery disease.  In men, not being able to have an erection (impotence).  Reduced thinking ability.   Follow these instructions at home: Lifestyle  Make changes that your doctor recommends.  Eat a healthy diet.  Lose weight if needed.  Avoid alcohol, medicines to help you relax, and some pain medicines.  Do not use any  products that contain nicotine or tobacco, such as cigarettes, e-cigarettes, and chewing tobacco. If you need help quitting, ask your doctor. General instructions  Take over-the-counter and prescription medicines only as told by your doctor.  If you were given a machine to use while you sleep, use it only as told by your doctor.  If you are having surgery, make sure to tell your doctor you have sleep apnea. You Faniel need to bring your device with you.  Keep all follow-up visits as told by your doctor. This is important. Contact a doctor if:  The machine that you were given to use during sleep bothers you or does not seem to be working.  You do not get better.  You get worse. Get help right away if:  Your chest hurts.  You have trouble breathing in enough air.  You have an uncomfortable feeling in your back, arms, or stomach.  You have trouble talking.  One side of your body feels weak.  A part of your face is hanging down. These symptoms Rahe be an emergency. Do not wait to see if the symptoms will go away. Get medical help right away. Call your local emergency services (911 in the [place] Do not drive yourself to the hospital. Summary  This condition affects breathing during sleep.  The most common cause is a collapsed or blocked airway.  The goal of treatment is to help you breathe normally while you sleep. This information is not intended to replace advice given to you by your health care provider. Make sure you discuss any questions you have with your health care provider. Document Revised: 01/01/2018 Document Reviewed: 11/10/2017 Elsevier Patient Education  2021 Elsevier Inc.  

## 2020-08-22 ENCOUNTER — Ambulatory Visit: Payer: Medicare Other | Admitting: Family Medicine

## 2020-08-22 ENCOUNTER — Encounter: Payer: Self-pay | Admitting: Family Medicine

## 2020-08-22 DIAGNOSIS — G4733 Obstructive sleep apnea (adult) (pediatric): Secondary | ICD-10-CM

## 2020-08-30 ENCOUNTER — Ambulatory Visit
Admission: RE | Admit: 2020-08-30 | Discharge: 2020-08-30 | Disposition: A | Discharge status: Home / Self Care | Payer: Medicare Other | Source: Ambulatory Visit | Attending: Nurse Practitioner | Admitting: Nurse Practitioner

## 2020-08-30 ENCOUNTER — Ambulatory Visit (INDEPENDENT_AMBULATORY_CARE_PROVIDER_SITE_OTHER): Payer: Medicare Other | Admitting: Nurse Practitioner

## 2020-08-30 ENCOUNTER — Other Ambulatory Visit: Payer: Self-pay

## 2020-08-30 DIAGNOSIS — M25561 Pain in right knee: Secondary | ICD-10-CM

## 2020-08-30 DIAGNOSIS — N401 Enlarged prostate with lower urinary tract symptoms: Secondary | ICD-10-CM

## 2020-08-30 DIAGNOSIS — M1711 Unilateral primary osteoarthritis, right knee: Secondary | ICD-10-CM | POA: Diagnosis not present

## 2020-08-30 DIAGNOSIS — M25461 Effusion, right knee: Secondary | ICD-10-CM | POA: Diagnosis not present

## 2020-08-30 MED ORDER — TADALAFIL 5 MG PO TABS: 5.0000 mg | ORAL_TABLET | Freq: Every day | ORAL | 0 refills | Status: DC | PRN

## 2020-08-30 NOTE — Patient Instructions (Signed)
Journal for Nurse Practitioners, 15(4), 263-267. Retrieved January 04, 2018 from http://clinicalkey.com/nursing">  Knee Exercises Ask your health care provider which exercises are safe for you. Do exercises exactly as told by your health care provider and adjust them as directed. It is normal to feel mild stretching, pulling, tightness, or discomfort as you do these exercises. Stop right away if you feel sudden pain or your pain gets worse. Do not begin these exercises until told by your health care provider. Stretching and range-of-motion exercises These exercises warm up your muscles and joints and improve the movement and flexibility of your knee. These exercises also help to relieve pain and swelling. Knee extension, prone 1. Lie on your abdomen (prone position) on a bed. 2. Place your left / right knee just beyond the edge of the surface so your knee is not on the bed. You can put a towel under your left / right thigh just above your kneecap for comfort. 3. Relax your leg muscles and allow gravity to straighten your knee (extension). You should feel a stretch behind your left / right knee. 4. Hold this position for __________ seconds. 5. Scoot up so your knee is supported between repetitions. Repeat __________ times. Complete this exercise __________ times a day. Knee flexion, active 1. Lie on your back with both legs straight. If this causes back discomfort, bend your left / right knee so your foot is flat on the floor. 2. Slowly slide your left / right heel back toward your buttocks. Stop when you feel a gentle stretch in the front of your knee or thigh (flexion). 3. Hold this position for __________ seconds. 4. Slowly slide your left / right heel back to the starting position. Repeat __________ times. Complete this exercise __________ times a day.   Quadriceps stretch, prone 1. Lie on your abdomen on a firm surface, such as a bed or padded floor. 2. Bend your left / right knee and hold  your ankle. If you cannot reach your ankle or pant leg, loop a belt around your foot and grab the belt instead. 3. Gently pull your heel toward your buttocks. Your knee should not slide out to the side. You should feel a stretch in the front of your thigh and knee (quadriceps). 4. Hold this position for __________ seconds. Repeat __________ times. Complete this exercise __________ times a day.   Hamstring, supine 1. Lie on your back (supine position). 2. Loop a belt or towel over the ball of your left / right foot. The ball of your foot is on the walking surface, right under your toes. 3. Straighten your left / right knee and slowly pull on the belt to raise your leg until you feel a gentle stretch behind your knee (hamstring). ? Do not let your knee bend while you do this. ? Keep your other leg flat on the floor. 4. Hold this position for __________ seconds. Repeat __________ times. Complete this exercise __________ times a day. Strengthening exercises These exercises build strength and endurance in your knee. Endurance is the ability to use your muscles for a long time, even after they get tired. Quadriceps, isometric This exercise stretches the muscles in front of your thigh (quadriceps) without moving your knee joint (isometric). 1. Lie on your back with your left / right leg extended and your other knee bent. Put a rolled towel or small pillow under your knee if told by your health care provider. 2. Slowly tense the muscles in the front of your   left / right thigh. You should see your kneecap slide up toward your hip or see increased dimpling just above the knee. This motion will push the back of the knee toward the floor. 3. For __________ seconds, hold the muscle as tight as you can without increasing your pain. 4. Relax the muscles slowly and completely. Repeat __________ times. Complete this exercise __________ times a day.   Straight leg raises This exercise stretches the muscles in  front of your thigh (quadriceps) and the muscles that move your hips (hip flexors). 1. Lie on your back with your left / right leg extended and your other knee bent. 2. Tense the muscles in the front of your left / right thigh. You should see your kneecap slide up or see increased dimpling just above the knee. Your thigh Kramlich even shake a bit. 3. Keep these muscles tight as you raise your leg 4-6 inches (10-15 cm) off the floor. Do not let your knee bend. 4. Hold this position for __________ seconds. 5. Keep these muscles tense as you lower your leg. 6. Relax your muscles slowly and completely after each repetition. Repeat __________ times. Complete this exercise __________ times a day. Hamstring, isometric 1. Lie on your back on a firm surface. 2. Bend your left / right knee about __________ degrees. 3. Dig your left / right heel into the surface as if you are trying to pull it toward your buttocks. Tighten the muscles in the back of your thighs (hamstring) to "dig" as hard as you can without increasing any pain. 4. Hold this position for __________ seconds. 5. Release the tension gradually and allow your muscles to relax completely for __________ seconds after each repetition. Repeat __________ times. Complete this exercise __________ times a day. Hamstring curls If told by your health care provider, do this exercise while wearing ankle weights. Begin with __________ lb weights. Then increase the weight by 1 lb (0.5 kg) increments. Do not wear ankle weights that are more than __________ lb. 1. Lie on your abdomen with your legs straight. 2. Bend your left / right knee as far as you can without feeling pain. Keep your hips flat against the floor. 3. Hold this position for __________ seconds. 4. Slowly lower your leg to the starting position. Repeat __________ times. Complete this exercise __________ times a day.   Squats This exercise strengthens the muscles in front of your thigh and knee  (quadriceps). 1. Stand in front of a table, with your feet and knees pointing straight ahead. You Mcgriff rest your hands on the table for balance but not for support. 2. Slowly bend your knees and lower your hips like you are going to sit in a chair. ? Keep your weight over your heels, not over your toes. ? Keep your lower legs upright so they are parallel with the table legs. ? Do not let your hips go lower than your knees. ? Do not bend lower than told by your health care provider. ? If your knee pain increases, do not bend as low. 3. Hold the squat position for __________ seconds. 4. Slowly push with your legs to return to standing. Do not use your hands to pull yourself to standing. Repeat __________ times. Complete this exercise __________ times a day. Wall slides This exercise strengthens the muscles in front of your thigh and knee (quadriceps). 1. Lean your back against a smooth wall or door, and walk your feet out 18-24 inches (46-61 cm) from it. 2.   Place your feet hip-width apart. 3. Slowly slide down the wall or door until your knees bend __________ degrees. Keep your knees over your heels, not over your toes. Keep your knees in line with your hips. 4. Hold this position for __________ seconds. Repeat __________ times. Complete this exercise __________ times a day.   Straight leg raises This exercise strengthens the muscles that rotate the leg at the hip and move it away from your body (hip abductors). 1. Lie on your side with your left / right leg in the top position. Lie so your head, shoulder, knee, and hip line up. You Rawling bend your bottom knee to help you keep your balance. 2. Roll your hips slightly forward so your hips are stacked directly over each other and your left / right knee is facing forward. 3. Leading with your heel, lift your top leg 4-6 inches (10-15 cm). You should feel the muscles in your outer hip lifting. ? Do not let your foot drift forward. ? Do not let your  knee roll toward the ceiling. 4. Hold this position for __________ seconds. 5. Slowly return your leg to the starting position. 6. Let your muscles relax completely after each repetition. Repeat __________ times. Complete this exercise __________ times a day.   Straight leg raises This exercise stretches the muscles that move your hips away from the front of the pelvis (hip extensors). 1. Lie on your abdomen on a firm surface. You can put a pillow under your hips if that is more comfortable. 2. Tense the muscles in your buttocks and lift your left / right leg about 4-6 inches (10-15 cm). Keep your knee straight as you lift your leg. 3. Hold this position for __________ seconds. 4. Slowly lower your leg to the starting position. 5. Let your leg relax completely after each repetition. Repeat __________ times. Complete this exercise __________ times a day. This information is not intended to replace advice given to you by your health care provider. Make sure you discuss any questions you have with your health care provider. Document Revised: 01/05/2018 Document Reviewed: 01/05/2018 Elsevier Patient Education  2021 Elsevier Inc.  

## 2020-08-30 NOTE — Progress Notes (Signed)
Subjective:    Patient ID: Jose Owen, male    DOB: 01-21-1941, 80 y.o.   MRN: [id number]  HPI: Jose Owen is a 80 y.o. male presenting for knee pain.  Chief Complaint  Patient presents with  . Pain    Believes he has arthritis in the right leg, inside knee. Pain for 2 wks. Taking no meds for pain  . Erectile Dysfunction    Wants to talk about a med for this   KNEE PAIN Duration: days Involved knee: right Mechanism of injury: unknown Location:medial Onset: sudden Severity: mild to moderate  Quality: sharp, shotting Frequency: intermittent Radiation: no Aggravating factors: walking, standing on it  Alleviating factors: Tylenol  Status: worse Treatments attempted: Tylenol  Relief with NSAIDs?:  yes Weakness with weight bearing or walking: yes Sensation of giving way: no Locking: no Popping: no Bruising: no Swelling: no Redness: no Paresthesias/decreased sensation: no Fevers: no   ERECTILE DYSFUNCTION Erectile Dysfunction status: uncontrolled Current treatment: flomax 0.4 mg nightly Satisfied with current treatment: flomax helps with lower BPH symptoms, however still unable to obtain or maintain erection Struggles with maintaining erection: yes Fevers: no Hematuria: no Waking up at night to urinate: improved with flomax Urinary incontinence: no Chest pain: no Shortness of breath: no  No Known Allergies  Outpatient Encounter Medications as of 08/30/2020  Medication Sig  . tadalafil (CIALIS) 5 MG tablet Take 1 tablet (5 mg total) by mouth daily as needed for erectile dysfunction.  . tamsulosin (FLOMAX) 0.4 MG CAPS capsule Take 1 capsule (0.4 mg total) by mouth daily.  . vitamin B-12 (CYANOCOBALAMIN) 1000 MCG tablet Take 1 tablet (1,000 mcg total) by mouth daily.  . [DISCONTINUED] Acetaminophen (TYLENOL PO) Take by mouth.   No facility-administered encounter medications on file as of 08/30/2020.    Patient Active Problem List   Diagnosis Date Noted  .  Acute pain of right knee 09/02/2020  . BPH (benign prostatic hyperplasia)   . Fatigue 12/05/2016    Past Medical History:  Diagnosis Date  . BPH (benign prostatic hyperplasia)     Relevant past medical, surgical, family and social history reviewed and updated as indicated. Interim medical history since our last visit reviewed.  Review of Systems Per HPI unless specifically indicated above     Objective:    There were no vitals taken for this visit.  Wt Readings from Last 3 Encounters:  05/10/20 256 lb 2 oz (116.2 kg)  11/14/19 249 lb (112.9 kg)  08/04/19 249 lb 5 oz (113.1 kg)    Physical Exam Vitals and nursing note reviewed.  Constitutional:      General: He is not in acute distress.    Appearance: Normal appearance. He is not toxic-appearing.  Cardiovascular:     Heart sounds: Normal heart sounds. No murmur heard.   Pulmonary:     Effort: Pulmonary effort is normal. No respiratory distress.     Breath sounds: Normal breath sounds. No wheezing or rales.  Genitourinary:    Comments: Deferred today using shared decision making Musculoskeletal:        General: Normal range of motion.     Right knee: Effusion and crepitus present. No deformity, erythema, ecchymosis or bony tenderness. Normal range of motion. No tenderness. Normal alignment and normal patellar mobility.     Left knee: Crepitus present. No effusion.     Right lower leg: No edema.     Left lower leg: No edema.  Skin:  General: Skin is warm.     Capillary Refill: Capillary refill takes less than 2 seconds.     Coloration: Skin is not jaundiced or pale.     Findings: No erythema.  Neurological:     Mental Status: He is alert and oriented to person, place, and time.     Motor: No weakness.     Gait: Gait normal.  Psychiatric:        Mood and Affect: Mood normal.        Behavior: Behavior normal.        Thought Content: Thought content normal.        Judgment: Judgment normal.       Assessment &  Plan:   Problem List Items Addressed This Visit      Genitourinary   BPH (benign prostatic hyperplasia)    With benefit from flomax, however erectile dysfunction remains.  Will trial use of tadalafil 5mg  prior to sexual intercourse.  Discussed side effects and watching for low blood pressure.  Follow up if symptoms do not improve.        Relevant Medications   tadalafil (CIALIS) 5 MG tablet     Other   Acute pain of right knee - Primary    Acute on chronic.  Likely osteoarthritis.  Will obtain plain imaging today per patient request.  Discussed options including knee injections, topical gel, NSAIDs, physical therapy, and referral to orthopedic for further management.  Patient elects to proceed with conservative treatment for now and reports the pain is very tolerable.  Start topical NSAIDs.  Return to clinic if pain worsens.      Relevant Orders   DG Knee Complete 4 Views Right (Completed)       Follow up plan: Return if symptoms worsen or fail to improve.

## 2020-09-02 ENCOUNTER — Encounter: Payer: Self-pay | Admitting: Nurse Practitioner

## 2020-09-02 DIAGNOSIS — M25561 Pain in right knee: Secondary | ICD-10-CM | POA: Insufficient documentation

## 2020-09-02 NOTE — Assessment & Plan Note (Addendum)
With benefit from flomax, however erectile dysfunction remains.  Will trial use of tadalafil 5mg  prior to sexual intercourse.  Discussed side effects and watching for low blood pressure.  Follow up if symptoms do not improve.

## 2020-09-02 NOTE — Assessment & Plan Note (Signed)
Acute on chronic.  Likely osteoarthritis.  Will obtain plain imaging today per patient request.  Discussed options including knee injections, topical gel, NSAIDs, physical therapy, and referral to orthopedic for further management.  Patient elects to proceed with conservative treatment for now and reports the pain is very tolerable.  Start topical NSAIDs.  Return to clinic if pain worsens.

## 2020-09-06 ENCOUNTER — Ambulatory Visit: Payer: Medicare Other | Admitting: Adult Health

## 2020-09-12 ENCOUNTER — Telehealth: Payer: Self-pay

## 2020-09-13 ENCOUNTER — Telehealth: Payer: Self-pay

## 2020-09-13 MED ORDER — PREDNISONE 10 MG PO TABS: 10.0000 mg | ORAL_TABLET | Freq: Every day | ORAL | 0 refills | Status: DC

## 2020-09-13 NOTE — Telephone Encounter (Signed)
He has been using the voltaren tid, not working. Wants the pred sent to pharmacy pl[facility]

## 2020-09-18 NOTE — Telephone Encounter (Signed)
No action, closing enconter

## 2020-09-19 DIAGNOSIS — H5203 Hypermetropia, bilateral: Secondary | ICD-10-CM | POA: Diagnosis not present

## 2020-10-13 ENCOUNTER — Other Ambulatory Visit: Payer: Self-pay | Admitting: Family Medicine

## 2020-10-26 ENCOUNTER — Ambulatory Visit (INDEPENDENT_AMBULATORY_CARE_PROVIDER_SITE_OTHER): Payer: Medicare Other | Admitting: Family Medicine

## 2020-10-26 ENCOUNTER — Encounter: Payer: Self-pay | Admitting: Family Medicine

## 2020-10-26 ENCOUNTER — Other Ambulatory Visit: Payer: Self-pay

## 2020-10-26 ENCOUNTER — Telehealth: Payer: Self-pay | Admitting: *Deleted

## 2020-10-26 VITALS — BP 128/64 | HR 86 | Temp 98.0°F | Resp 14 | Ht 74.0 in | Wt 252.0 lb

## 2020-10-26 DIAGNOSIS — C44622 Squamous cell carcinoma of skin of right upper limb, including shoulder: Secondary | ICD-10-CM | POA: Diagnosis not present

## 2020-10-26 DIAGNOSIS — L989 Disorder of the skin and subcutaneous tissue, unspecified: Secondary | ICD-10-CM | POA: Diagnosis not present

## 2020-10-26 DIAGNOSIS — C4492 Squamous cell carcinoma of skin, unspecified: Secondary | ICD-10-CM | POA: Diagnosis not present

## 2020-10-26 NOTE — Telephone Encounter (Signed)
Received call from patient.   Reports that he has been taking Cialis 5mg  for ED, but does not feel that  medication is helping.   Inquired if he could double the dose or change to another medication.   Please advise.

## 2020-10-26 NOTE — Progress Notes (Signed)
Subjective:    Patient ID: Jose Owen, male    DOB: 1941-01-05, 80 y.o.   MRN: [id number]  HPI Patient has a lesion on the dorsum of his right hand that has been steadily growing over the last few months.  Is also tender and painful.  Is a dome-shaped lesion that is scaly and tender.  Is 1 cm in diameter.  He denies any bleeding or purulent drainage.  However it does have raised rolled edges and is concerning for a skin cancer Past Medical History:  Diagnosis Date   BPH (benign prostatic hyperplasia)    History reviewed. No pertinent surgical history. Current Outpatient Medications on File Prior to Visit  Medication Sig Dispense Refill   tadalafil (CIALIS) 5 MG tablet Take 1 tablet (5 mg total) by mouth daily as needed for erectile dysfunction. 10 tablet 0   tamsulosin (FLOMAX) 0.4 MG CAPS capsule TAKE 1 CAPSULE BY MOUTH EVERY DAY 90 capsule 1   vitamin B-12 (CYANOCOBALAMIN) 1000 MCG tablet Take 1 tablet (1,000 mcg total) by mouth daily.     No current facility-administered medications on file prior to visit.   No Known Allergies Social History   Socioeconomic History   Marital status: Married    Spouse name: Not on file   Number of children: Not on file   Years of education: Not on file   Highest education level: Not on file  Occupational History   Not on file  Tobacco Use   Smoking status: Former    Types: Cigarettes   Smokeless tobacco: Never  Substance and Sexual Activity   Alcohol use: No   Drug use: No   Sexual activity: Not on file  Other Topics Concern   Not on file  Social History Narrative   Not on file   Social Determinants of Health   Financial Resource Strain: Not on file  Food Insecurity: Not on file  Transportation Needs: Not on file  Physical Activity: Not on file  Stress: Not on file  Social Connections: Not on file  Intimate Partner Violence: Not on file   History reviewed. No pertinent family history. Father has a history of atrial  fibrillation and prostate cancer.  Mother died due to old age.  He does have a family history of familial lipomatosis  Review of Systems     Objective:   Physical Exam Vitals reviewed.  Constitutional:      General: He is not in acute distress.    Appearance: Normal appearance. He is obese. He is not ill-appearing, toxic-appearing or diaphoretic.  HENT:     Head: Normocephalic and atraumatic.  Cardiovascular:     Rate and Rhythm: Normal rate and regular rhythm.     Pulses: Normal pulses.     Heart sounds: Normal heart sounds.  Pulmonary:     Effort: Pulmonary effort is normal.     Breath sounds: Normal breath sounds. No wheezing, rhonchi or rales.  Chest:     Chest wall: No tenderness.  Musculoskeletal:       Hands:  Skin:    General: Skin is warm.     Findings: Lesion present.  Neurological:     Mental Status: He is alert.          Assessment & Plan:  Skin lesion of hand - Plan: Pathology Report ([facility]) I suspect that this is likely a skin cancer.  I anesthetized the lesion with 0.1% lidocaine with epinephrine.  I would remove the lesion in  its entirety with a shave biopsy technique.  Hemostasis was then achieved with Drysol and a Band-Aid.  The lesion was sent to pathology in a labeled container.  Await for the biopsy results.

## 2020-10-29 HISTORY — PX: SKIN CANCER EXCISION: SHX779

## 2020-10-29 MED ORDER — TADALAFIL 20 MG PO TABS: 10.0000 mg | ORAL_TABLET | Freq: Every day | ORAL | 11 refills | Status: DC | PRN

## 2020-10-29 NOTE — Telephone Encounter (Signed)
Call placed to patient and patient made aware.   Prescription sent to pharmacy.  

## 2020-10-30 LAB — TISSUE SPECIMEN

## 2020-10-30 LAB — PATHOLOGY REPORT

## 2020-11-09 ENCOUNTER — Ambulatory Visit (INDEPENDENT_AMBULATORY_CARE_PROVIDER_SITE_OTHER): Payer: Medicare Other | Admitting: Family Medicine

## 2020-11-09 ENCOUNTER — Other Ambulatory Visit: Payer: Self-pay

## 2020-11-09 ENCOUNTER — Encounter: Payer: Self-pay | Admitting: Family Medicine

## 2020-11-09 VITALS — BP 122/68 | HR 80 | Temp 98.3°F | Resp 14 | Ht 74.0 in | Wt 253.0 lb

## 2020-11-09 DIAGNOSIS — D0461 Carcinoma in situ of skin of right upper limb, including shoulder: Secondary | ICD-10-CM | POA: Diagnosis not present

## 2020-11-09 DIAGNOSIS — L989 Disorder of the skin and subcutaneous tissue, unspecified: Secondary | ICD-10-CM

## 2020-11-09 NOTE — Progress Notes (Signed)
Subjective:    Patient ID: Jose Owen, male    DOB: April 04, 1940, 80 y.o.   MRN: [id number]  HPI Please see last office visit.  Biopsy revealed invasive squamous cell carcinoma and the deep margins were involved.  Patient is here today for wider excision. Past Medical History:  Diagnosis Date   BPH (benign prostatic hyperplasia)    History reviewed. No pertinent surgical history. Current Outpatient Medications on File Prior to Visit  Medication Sig Dispense Refill   tadalafil (CIALIS) 20 MG tablet Take 0.5-1 tablets (10-20 mg total) by mouth daily as needed for erectile dysfunction. 30 tablet 11   tamsulosin (FLOMAX) 0.4 MG CAPS capsule TAKE 1 CAPSULE BY MOUTH EVERY DAY 90 capsule 1   vitamin B-12 (CYANOCOBALAMIN) 1000 MCG tablet Take 1 tablet (1,000 mcg total) by mouth daily.     No current facility-administered medications on file prior to visit.   No Known Allergies Social History   Socioeconomic History   Marital status: Widowed    Spouse name: Not on file   Number of children: Not on file   Years of education: Not on file   Highest education level: Not on file  Occupational History   Not on file  Tobacco Use   Smoking status: Former    Types: Cigarettes   Smokeless tobacco: Never  Substance and Sexual Activity   Alcohol use: No   Drug use: No   Sexual activity: Not on file  Other Topics Concern   Not on file  Social History Narrative   Not on file   Social Determinants of Health   Financial Resource Strain: Not on file  Food Insecurity: Not on file  Transportation Needs: Not on file  Physical Activity: Not on file  Stress: Not on file  Social Connections: Not on file  Intimate Partner Violence: Not on file   History reviewed. No pertinent family history. Father has a history of atrial fibrillation and prostate cancer.  Mother died due to old age.  He does have a family history of familial lipomatosis  Review of Systems     Objective:   Physical  Exam Vitals reviewed.  Constitutional:      General: He is not in acute distress.    Appearance: Normal appearance. He is obese. He is not ill-appearing, toxic-appearing or diaphoretic.  HENT:     Head: Normocephalic and atraumatic.  Cardiovascular:     Rate and Rhythm: Normal rate and regular rhythm.     Pulses: Normal pulses.     Heart sounds: Normal heart sounds.  Pulmonary:     Effort: Pulmonary effort is normal.     Breath sounds: Normal breath sounds. No wheezing, rhonchi or rales.  Chest:     Chest wall: No tenderness.  Musculoskeletal:       Hands:  Skin:    General: Skin is warm.     Findings: Lesion present.  Neurological:     Mental Status: He is alert.  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  Patient was prepped and draped in sterile fashion.  An ellipse was performed all around the previous biopsy site down through the fascia into the subcutaneous fat.  The residual  sections were then sent to pathology in a labeled container.  The skin edges were then approximated with 5 simple interrupted 3-0 Ethilon sutures.  Return in 7 days for suture removal

## 2020-11-14 LAB — PATHOLOGY REPORT

## 2020-11-14 LAB — TISSUE SPECIMEN

## 2020-11-16 ENCOUNTER — Ambulatory Visit (INDEPENDENT_AMBULATORY_CARE_PROVIDER_SITE_OTHER): Payer: Medicare Other | Admitting: Family Medicine

## 2020-11-16 ENCOUNTER — Other Ambulatory Visit: Payer: Self-pay

## 2020-11-16 ENCOUNTER — Encounter: Payer: Self-pay | Admitting: Family Medicine

## 2020-11-16 VITALS — BP 138/68 | HR 70 | Temp 97.9°F | Resp 14 | Ht 74.0 in | Wt 253.0 lb

## 2020-11-16 DIAGNOSIS — Z4802 Encounter for removal of sutures: Secondary | ICD-10-CM

## 2020-11-16 MED ORDER — TADALAFIL 20 MG PO TABS: 10.0000 mg | ORAL_TABLET | Freq: Every day | ORAL | 11 refills | Status: DC | PRN

## 2020-11-16 NOTE — Progress Notes (Signed)
Subjective:    Patient ID: Jose Owen, male    DOB: [birth year], 80 y.o.   MRN: [id number]  HPI 11/09/20 Please see last office visit.  Biopsy revealed invasive squamous cell carcinoma and the deep margins were involved.  Patient is here today for wider excision.  At that time, my plan was:  0.1% lidocaine with epinephrine.  Patient was prepped and draped in sterile fashion.  An ellipse was performed all around the previous biopsy site down through the fascia into the subcutaneous fat.  The residual sections were then sent to pathology in a labeled container.  The skin edges were then approximated with 5 simple interrupted 3-0 Ethilon sutures.  Return in 7 days for suture removal  11/16/20 Here for suture removal.  During excision and biopsy, lesion essentially came apart in the formalin container is atypia even to the margins.  However this was not a single continuous lesion but rather pieces of a lesion that "fell apart" in the container.  Clinically I believe that we remove the entire lesion down to the subcutaneous fascia Past Medical History:  Diagnosis Date   BPH (benign prostatic hyperplasia)    No past surgical history on file. Current Outpatient Medications on File Prior to Visit  Medication Sig Dispense Refill   tadalafil (CIALIS) 20 MG tablet Take 0.5-1 tablets (10-20 mg total) by mouth daily as needed for erectile dysfunction. 30 tablet 11   tamsulosin (FLOMAX) 0.4 MG CAPS capsule TAKE 1 CAPSULE BY MOUTH EVERY DAY 90 capsule 1   vitamin B-12 (CYANOCOBALAMIN) 1000 MCG tablet Take 1 tablet (1,000 mcg total) by mouth daily.     No current facility-administered medications on file prior to visit.   No Known Allergies Social History   Socioeconomic History   Marital status: Widowed    Spouse name: Not on file   Number of children: Not on file   Years of education: Not on file   Highest education level: Not on file  Occupational History   Not on file  Tobacco Use    Smoking status: Former    Types: Cigarettes   Smokeless tobacco: Never  Substance and Sexual Activity   Alcohol use: No   Drug use: No   Sexual activity: Not on file  Other Topics Concern   Not on file  Social History Narrative   Not on file   Social Determinants of Health   Financial Resource Strain: Not on file  Food Insecurity: Not on file  Transportation Needs: Not on file  Physical Activity: Not on file  Stress: Not on file  Social Connections: Not on file  Intimate Partner Violence: Not on file   No family history on file. Father has a history of atrial fibrillation and prostate cancer.  Mother died due to old age.  He does have a family history of familial lipomatosis  Review of Systems     Objective:   Physical Exam Vitals reviewed.  Constitutional:      General: He is not in acute distress.    Appearance: Normal appearance. He is obese. He is not ill-appearing, toxic-appearing or diaphoretic.  HENT:     Head: Normocephalic and atraumatic.  Cardiovascular:     Rate and Rhythm: Normal rate and regular rhythm.     Pulses: Normal pulses.     Heart sounds: Normal heart sounds.  Pulmonary:     Effort: Pulmonary effort is normal.     Breath sounds: Normal breath sounds. No wheezing, rhonchi  or rales.  Chest:     Chest wall: No tenderness.  Musculoskeletal:       Hands:  Skin:    General: Skin is warm.     Findings: Lesion present.  Neurological:     Mental Status: He is alert.         Assessment & Plan:  Visit for suture removal 5 sutures removed without difficulty.  Wound reinforced with Steri-Strips.  Wound care discussed.  Monitor the area clinically for any recurrence

## 2020-11-16 NOTE — Addendum Note (Signed)
Addended by: Phillips Odor on: 11/16/2020 08:13 AM   Modules accepted: Orders

## 2020-11-26 NOTE — Progress Notes (Addendum)
PATIENT: Jose Owen DOB: 05-06-1940  REASON FOR VISIT: follow up HISTORY FROM: patient  Chief Complaint  Patient presents with   Follow-up    Rm 1, alone. Here for 6 month CPAP f/u, pt would like to try a new mask, pt reports having issues w dry mouth.      HISTORY OF PRESENT ILLNESS: 11/28/20 ALL:  Jose Owen is a 80 y.o. male here today for follow up for OSA on CPAP.  He has continued to work on improving compliance. He admits that he does not always take his machine with him if he is out of town or visiting his dad. He continues to have dry mouth. He is using a FFM. He is interested in trying a different mask to see if this Weyers help. He wakes 2-3 times at night and needs to drink water. He reports that DME adjusted humidity but he has not tried to adjust again at home. Otherwise, he is doing well.     HISTORY: (copied from Dr Guadelupe Sabin previous note)  Mr. Jose Owen is a 80 year old right-handed gentleman with an underlying medical history of low back pain, and mild obesity, who presents for follow-up consultation of his obstructive sleep apnea after interim testing and starting CPAP therapy.  The patient is unaccompanied today.  I first met him at the request of his primary care physician on 08/04/2019, at which time he reported snoring and excessive daytime somnolence.  He also had a history of low testosterone.  He was advised to proceed with sleep testing.  He had a baseline sleep study followed by a CPAP titration study.  Baseline sleep study from 11/21/2019 showed a sleep efficiency of 82.5%, sleep latency 6 minutes, REM latency 67 minutes.  He had an increased percentage of stage II sleep, and a reduced percentage of REM sleep at 9.5%.  He had 29 obstructive and 13 central apneas, 130 hypopneas for the night.  Total AHI was 30.5/h, REM AHI 39.4/h, supine AHI 32.7/h and O2 nadir 82%.  He was advised to proceed with a full night CPAP titration study, which he had on 12/19/2019. Sleep latency  was 8 minutes, REM latency 43 minutes, sleep efficiency 86.4%, he had a normal percentage of stage I and stage II sleep, and increased percentage of slow-wave sleep and a near normal percentage of REM sleep at 17.3%.  He was fitted with a large full facemask after trying a nasal mask.  CPAP was titrated from 5 cm to 10 cm.  On the final pressure his AHI was 0.8/h, with nonsupine REM sleep achieved and O2 nadir of 91%.  Based on the test results I prescribed CPAP therapy for home use.  Set up date was 03/15/2020.    Today, 05/10/20: I reviewed his CPAP compliance data from 04/09/2020 through 05/12/2020, which is a total of 30 days, during which time he used his machine 27 days with percent use days greater than 4 hours at 57%, indicating mildly suboptimal compliance, average usage of 4 hours and 34 minutes, residual AHI at goal at 1.8/h, leak on the low side with a 95th percentile at 3.7 L/min, pressure of 10 cm with EPR of 3.  In the month of early January through early February 20 04/05/2020 and 05/04/2020 his percent use days greater than 4 hours was slightly better at 60%, still suboptimal.  He reports overall doing fairly well but he does have significant issues with mouth dryness.  He called his DME provider  at least twice, the temperature was first turned down to 74 degrees and then to room air but he still wakes up with severely dry mouth.  He has to sip water in the middle of the night and sometimes he just gets too frustrated and does not put it back on.  He did go to [place] for his grandsons wedding and missed 2 days.  He is willing to continue with treatment and actually has already noticed some benefit and that his nocturia is better and daytime energy are better.  He is motivated to continue with treatment and working on compliance.  He is using a large Simplus full facemask without any problems.  Chinstrap was given to him but it does not fit and he does not actually need it.    The patient's allergies,  current medications, family history, past medical history, past social history, past surgical history and problem list were reviewed and updated as appropriate.   REVIEW OF SYSTEMS: Out of a complete 14 system review of symptoms, the patient complains only of the following symptoms, none and all other reviewed systems are negative.  ESS: 10  ALLERGIES: No Known Allergies  HOME MEDICATIONS: Outpatient Medications Prior to Visit  Medication Sig Dispense Refill   tadalafil (CIALIS) 20 MG tablet Take 0.5-1 tablets (10-20 mg total) by mouth daily as needed for erectile dysfunction. 30 tablet 11   tamsulosin (FLOMAX) 0.4 MG CAPS capsule TAKE 1 CAPSULE BY MOUTH EVERY DAY 90 capsule 1   vitamin B-12 (CYANOCOBALAMIN) 1000 MCG tablet Take 1 tablet (1,000 mcg total) by mouth daily.     No facility-administered medications prior to visit.    PAST MEDICAL HISTORY: Past Medical History:  Diagnosis Date   BPH (benign prostatic hyperplasia)     PAST SURGICAL HISTORY: Past Surgical History:  Procedure Laterality Date   SKIN CANCER EXCISION  10/2020   right hand    FAMILY HISTORY: History reviewed. No pertinent family history.  SOCIAL HISTORY: Social History   Socioeconomic History   Marital status: Widowed    Spouse name: Not on file   Number of children: Not on file   Years of education: Not on file   Highest education level: Not on file  Occupational History   Not on file  Tobacco Use   Smoking status: Former    Types: Cigarettes   Smokeless tobacco: Never  Substance and Sexual Activity   Alcohol use: No   Drug use: No   Sexual activity: Not on file  Other Topics Concern   Not on file  Social History Narrative   Not on file   Social Determinants of Health   Financial Resource Strain: Not on file  Food Insecurity: Not on file  Transportation Needs: Not on file  Physical Activity: Not on file  Stress: Not on file  Social Connections: Not on file  Intimate Partner  Violence: Not on file     PHYSICAL EXAM  Vitals:   11/28/20 0750  BP: 126/72  Pulse: 73  Weight: 253 lb (114.8 kg)  Height: 6' 2"  (1.88 m)   Body mass index is 32.48 kg/m.  Generalized: Well developed, in no acute distress  Cardiology: normal rate and rhythm, no murmur noted Respiratory: clear to auscultation bilaterally  Neurological examination  Mentation: Alert oriented to time, place, history taking. Follows all commands speech and language fluent Cranial nerve II-XII: Pupils were equal round reactive to light. Extraocular movements were full, visual field were full  Motor: The  motor testing reveals 5 over 5 strength of all 4 extremities. Good symmetric motor tone is noted throughout.  Gait and station: Gait is normal.    DIAGNOSTIC DATA (LABS, IMAGING, TESTING) - I reviewed patient records, labs, notes, testing and imaging myself where available.  No flowsheet data found.   Lab Results  Component Value Date   WBC 4.7 07/22/2019   HGB 15.6 07/22/2019   HCT 46.0 07/22/2019   MCV 93.5 07/22/2019   PLT 205 07/22/2019      Component Value Date/Time   NA 141 07/22/2019 0938   K 4.0 07/22/2019 0938   CL 106 07/22/2019 0938   CO2 26 07/22/2019 0938   GLUCOSE 105 (H) 07/22/2019 0938   BUN 13 07/22/2019 0938   CREATININE 0.92 07/22/2019 0938   CALCIUM 9.5 07/22/2019 0938   PROT 6.3 07/22/2019 0938   AST 22 07/22/2019 0938   ALT 18 07/22/2019 0938   BILITOT 0.9 07/22/2019 0938   GFRNONAA 81 07/22/2019 0938   GFRAA 93 07/22/2019 0938   Lab Results  Component Value Date   CHOL 164 07/22/2019   HDL 39 (L) 07/22/2019   LDLCALC 104 (H) 07/22/2019   TRIG 118 07/22/2019   CHOLHDL 4.2 07/22/2019   No results found for: HGBA1C Lab Results  Component Value Date   ZOXWRUEA54 098 07/22/2019   Lab Results  Component Value Date   TSH 2.02 07/22/2019     ASSESSMENT AND PLAN 80 y.o. year old male  has a past medical history of BPH (benign prostatic hyperplasia).  here with     ICD-10-CM   1. OSA on CPAP  G47.33 For home use only DME continuous positive airway pressure (CPAP)   Z99.89         Ranen Doolin Nauert is doing fairly well on CPAP therapy. Compliance report reveals excellent daily and acceptable four hour compliance. He continues to have difficulty with dry mouth. I will order a mask refitting per his request. I have advised he try Biotene procuts OTC as well as adjust humidity at home. He was encouraged to continue using CPAP nightly and for greater than 4 hours each night. Risks of untreated sleep apnea review and education materials provided. Healthy lifestyle habits encouraged. He will follow up in 6 months, sooner if needed. He verbalizes understanding and agreement with this plan.    Orders Placed This Encounter  Procedures   For home use only DME continuous positive airway pressure (CPAP)    Mask refitting, having trouble with dry mouth    Order Specific Question:   Length of Need    Answer:   Lifetime    Order Specific Question:   Patient has OSA or probable OSA    Answer:   Yes    Order Specific Question:   Is the patient currently using CPAP in the home    Answer:   Yes    Order Specific Question:   Settings    Answer:   Other see comments    Order Specific Question:   CPAP supplies needed    Answer:   Mask, headgear, cushions, filters, heated tubing and water chamber      No orders of the defined types were placed in this encounter.     Debbora Presto, FNP-C 11/28/2020, 8:16 AM [facility] [address], [place], [place] [address] [phone]  I reviewed the above note and documentation by the Nurse Practitioner and agree with the history, exam, assessment and  plan as outlined above. I was available for consultation. Star Age, MD, PhD [facility])

## 2020-11-28 ENCOUNTER — Other Ambulatory Visit: Payer: Self-pay

## 2020-11-28 ENCOUNTER — Ambulatory Visit: Payer: Medicare Other | Admitting: Family Medicine

## 2020-11-28 ENCOUNTER — Encounter: Payer: Self-pay | Admitting: Family Medicine

## 2020-11-28 VITALS — BP 126/72 | HR 73 | Ht 74.0 in | Wt 253.0 lb

## 2020-11-28 DIAGNOSIS — Z9989 Dependence on other enabling machines and devices: Secondary | ICD-10-CM | POA: Diagnosis not present

## 2020-11-28 DIAGNOSIS — G4733 Obstructive sleep apnea (adult) (pediatric): Secondary | ICD-10-CM | POA: Diagnosis not present

## 2020-11-28 NOTE — Patient Instructions (Signed)
Please continue using your CPAP regularly. While your insurance requires that you use CPAP at least 4 hours each night on 70% of the nights, I recommend, that you not skip any nights and use it throughout the night if you can. Getting used to CPAP and staying with the treatment long term does take time and patience and discipline. Untreated obstructive sleep apnea when it is moderate to severe can have an adverse impact on cardiovascular health and raise her risk for heart disease, arrhythmias, hypertension, congestive heart failure, stroke and diabetes. Untreated obstructive sleep apnea causes sleep disruption, nonrestorative sleep, and sleep deprivation. This can have an impact on your day to day functioning and cause daytime sleepiness and impairment of cognitive function, memory loss, mood disturbance, and problems focussing. Using CPAP regularly can improve these symptoms.  We will order mask refitting. Adjust humidity at home. Consider Biotene products over the counter for dry mouth   Follow up in 6 months

## 2020-12-21 ENCOUNTER — Ambulatory Visit (INDEPENDENT_AMBULATORY_CARE_PROVIDER_SITE_OTHER): Payer: Medicare Other

## 2020-12-21 ENCOUNTER — Other Ambulatory Visit: Payer: Self-pay

## 2020-12-21 ENCOUNTER — Telehealth: Payer: Self-pay

## 2020-12-21 VITALS — Ht 74.0 in | Wt 253.0 lb

## 2020-12-21 DIAGNOSIS — Z0000 Encounter for general adult medical examination without abnormal findings: Secondary | ICD-10-CM | POA: Diagnosis not present

## 2020-12-21 NOTE — Progress Notes (Signed)
Subjective:   Jose Owen is a 80 y.o. male who presents for Medicare Annual/Subsequent preventive examination.  Review of Systems       I connected with  Jose Owen on 12/21/20 by a telephone enabled telemedicine application and verified that I am speaking with the correct person using two identifiers.   I discussed the limitations of evaluation and management by telemedicine. The patient expressed understanding and agreed to proceed.     Objective:    There were no vitals filed for this visit. There is no height or weight on file to calculate BMI.  No flowsheet data found.  Current Medications (verified) Outpatient Encounter Medications as of 12/21/2020  Medication Sig   tadalafil (CIALIS) 20 MG tablet Take 0.5-1 tablets (10-20 mg total) by mouth daily as needed for erectile dysfunction.   tamsulosin (FLOMAX) 0.4 MG CAPS capsule TAKE 1 CAPSULE BY MOUTH EVERY DAY   vitamin B-12 (CYANOCOBALAMIN) 1000 MCG tablet Take 1 tablet (1,000 mcg total) by mouth daily.   No facility-administered encounter medications on file as of 12/21/2020.    Allergies (verified) Patient has no known allergies.   History: Past Medical History:  Diagnosis Date   BPH (benign prostatic hyperplasia)    Past Surgical History:  Procedure Laterality Date   SKIN CANCER EXCISION  10/2020   right hand   No family history on file. Social History   Socioeconomic History   Marital status: Widowed    Spouse name: Not on file   Number of children: Not on file   Years of education: Not on file   Highest education level: Not on file  Occupational History   Not on file  Tobacco Use   Smoking status: Former    Types: Cigarettes   Smokeless tobacco: Never  Substance and Sexual Activity   Alcohol use: No   Drug use: No   Sexual activity: Not on file  Other Topics Concern   Not on file  Social History Narrative   Not on file   Social Determinants of Health   Financial Resource Strain: Not on  file  Food Insecurity: Not on file  Transportation Needs: Not on file  Physical Activity: Not on file  Stress: Not on file  Social Connections: Not on file    Tobacco Counseling Counseling given: Not Answered   Clinical Intake:   Diabetic?no   Activities of Daily Living No flowsheet data found.  Patient Care Team: Jose Brooks, MD as PCP - General (Family Medicine)  Indicate any recent Medical Services you [place] have received from other than [facility] providers in the past year (date [place] be approximate).     Assessment:   This is a routine wellness examination for Jose Owen.  Hearing/Vision screen No results found.  Dietary issues and exercise activities discussed:     Goals Addressed   None    Depression Screen PHQ 2/9 Scores 07/22/2019 03/05/2017 12/05/2016 02/14/2016  PHQ - 2 Score 0 0 0 0    Fall Risk Fall Risk  08/30/2020 07/22/2019 03/05/2017 12/05/2016 02/14/2016  Falls in the past year? 0 0 No No No  Number falls in past yr: 0 - - - -  Injury with Fall? 0 - - - -  Follow up - Falls evaluation completed - - -    FALL RISK PREVENTION PERTAINING TO THE HOME:  Any stairs in or around the home? Yes  If so, are there any without handrails? Yes  Home free of loose  throw rugs in walkways, pet beds, electrical cords, etc? No  Adequate lighting in your home to reduce risk of falls? Yes   ASSISTIVE DEVICES UTILIZED TO PREVENT FALLS:  Life alert? No  Use of a cane, walker or w/c? No  Grab bars in the bathroom? Yes  Shower chair or bench in shower? Yes  Elevated toilet seat or a handicapped toilet? No   TIMED UP AND GO:  Was the test performed? No . Telephonic visit.   Cognitive Function:    Memory Test completed Patient able to repeat back 5 word phrase.     Immunizations Immunization History  Administered Date(s) Administered   Influenza,inj,Quad PF,6+ Mos 03/03/2013   PFIZER(Purple Top)SARS-COV-2 Vaccination 04/22/2019, 05/23/2019   Pneumococcal  Conjugate-13 03/03/2013   Pneumococcal Polysaccharide-23 07/22/2019    TDAP status: Due, Education has been provided regarding the importance of this vaccine. Advised Jose Owen receive this vaccine at local pharmacy or Health Dept. Aware to provide a copy of the vaccination record if obtained from local pharmacy or Health Dept. Verbalized acceptance and understanding.  Flu Vaccine status: Due, Education has been provided regarding the importance of this vaccine. Advised Jose Owen receive this vaccine at local pharmacy or Health Dept. Aware to provide a copy of the vaccination record if obtained from local pharmacy or Health Dept. Verbalized acceptance and understanding.  Pneumococcal vaccine status: Up to date  Covid-19 vaccine status: Completed vaccines patient has not had a booster- denies wanting a booster.  Qualifies for Shingles Vaccine? Yes   Zostavax completed No   Shingrix Completed?: No.    Education has been provided regarding the importance of this vaccine. Patient has been advised to call insurance company to determine out of pocket expense if they have not yet received this vaccine. Advised Jose Owen also receive vaccine at local pharmacy or Health Dept. Verbalized acceptance and understanding.  Screening Tests Health Maintenance  Topic Date Due   Zoster Vaccines- Shingrix (1 of 2) Never done   COVID-19 Vaccine (3 - Pfizer risk series) 06/20/2019   INFLUENZA VACCINE  10/29/2020   TETANUS/TDAP  08/30/2021 (Originally 03/25/1960)   Hepatitis C Screening  08/30/2021 (Originally 03/25/1959)   HPV VACCINES  Aged Out    Health Maintenance  Health Maintenance Due  Topic Date Due   Zoster Vaccines- Shingrix (1 of 2) Never done   COVID-19 Vaccine (3 - Pfizer risk series) 06/20/2019   INFLUENZA VACCINE  10/29/2020    Colorectal cancer screening: No longer required.   Lung Cancer Screening: (Low Dose CT Chest recommended if Age 80-80 years, 30 pack-year currently smoking OR have quit w/in  15years.) does not qualify.   Lung Cancer Screening Referral: no  Additional Screening:  Hepatitis C Screening: does not qualify; Completed   Vision Screening: Recommended annual ophthalmology exams for early detection of glaucoma and other disorders of the eye. Is the patient up to date with their annual eye exam?  Yes  Who is the provider or what is the name of the office in which the patient attends annual eye exams? Dr. Blima Ledger If pt is not established with a provider, would they like to be referred to a provider to establish care? No .   Dental Screening: Recommended annual dental exams for proper oral hygiene  Patient and I talked about recent removal of cancerous skin lesion on the hand. Patient made aware of importance of Dermatologist and made recommendations of dermatologist he could see.   Community Resource Referral / Chronic Care Management: CRR  required this visit?  No   CCM required this visit?  No      Plan:     I have personally reviewed and noted the following in the patient's chart:   Medical and social history Use of alcohol, tobacco or illicit drugs  Current medications and supplements including opioid prescriptions. Patient is not currently taking opioid prescriptions. Functional ability and status Nutritional status Physical activity Advanced directives List of other physicians Hospitalizations, surgeries, and ER visits in previous 12 months Vitals Screenings to include cognitive, depression, and falls Referrals and appointments  In addition, I have reviewed and discussed with patient certain preventive protocols, quality metrics, and best practice recommendations. A written personalized care plan for preventive services as well as general preventive health recommendations were provided to patient.     Jose Stammer, RN   12/21/2020   Nurse Notes: Mr. Giuffre was a pleasant 80 yr old male who was in good spirits for today's telephonic visit.   Advised to complete annual Medicare wellness visit in one year.  Non-Face to Face 28 minutes.   Patient: at home Provider: in office.

## 2020-12-21 NOTE — Telephone Encounter (Signed)
Pt states he is currently taking Vitamin B12 1,000 mcg per day. Pt states he is still having issues with feeling tired and asks if he should increase the dose? His last B12 labs were done 07/22/19 with a result of [phone]. Thank you!

## 2020-12-21 NOTE — Progress Notes (Deleted)
Patient widowed.

## 2020-12-25 ENCOUNTER — Other Ambulatory Visit: Payer: Self-pay

## 2020-12-25 ENCOUNTER — Encounter: Payer: Self-pay | Admitting: Family Medicine

## 2020-12-25 ENCOUNTER — Ambulatory Visit (INDEPENDENT_AMBULATORY_CARE_PROVIDER_SITE_OTHER): Payer: Medicare Other | Admitting: Family Medicine

## 2020-12-25 VITALS — BP 130/72 | HR 76 | Temp 98.1°F | Resp 16 | Ht 74.0 in | Wt 256.0 lb

## 2020-12-25 DIAGNOSIS — Z125 Encounter for screening for malignant neoplasm of prostate: Secondary | ICD-10-CM | POA: Diagnosis not present

## 2020-12-25 DIAGNOSIS — E538 Deficiency of other specified B group vitamins: Secondary | ICD-10-CM

## 2020-12-25 DIAGNOSIS — R5382 Chronic fatigue, unspecified: Secondary | ICD-10-CM | POA: Diagnosis not present

## 2020-12-25 DIAGNOSIS — Z23 Encounter for immunization: Secondary | ICD-10-CM | POA: Diagnosis not present

## 2020-12-25 NOTE — Progress Notes (Signed)
Subjective:    Patient ID: Jose Owen, male    DOB: February 13, 1941, 80 y.o.   MRN: [id number]  HPI  Patient presents today for fatigue.  He states that he feels better when he takes a B12 pill however after a few hours he feels drained and tired.  He denies any chest pain shortness of breath or dyspnea on exertion.  He denies any fevers or chills or night sweats.  He denies any recent weight loss or palpitations or heat or cold intolerance.  He denies any nausea vomiting diarrhea melena or hematochezia.  He denies any dysuria urgency or frequency.  He denies any hypersomnolence.  He does report just feeling tired all the time. Past Medical History:  Diagnosis Date   BPH (benign prostatic hyperplasia)    Past Surgical History:  Procedure Laterality Date   SKIN CANCER EXCISION  10/2020   right hand   Current Outpatient Medications on File Prior to Visit  Medication Sig Dispense Refill   tadalafil (CIALIS) 20 MG tablet Take 0.5-1 tablets (10-20 mg total) by mouth daily as needed for erectile dysfunction. 30 tablet 11   tamsulosin (FLOMAX) 0.4 MG CAPS capsule TAKE 1 CAPSULE BY MOUTH EVERY DAY 90 capsule 1   vitamin B-12 (CYANOCOBALAMIN) 1000 MCG tablet Take 1 tablet (1,000 mcg total) by mouth daily.     No current facility-administered medications on file prior to visit.   No Known Allergies Social History   Socioeconomic History   Marital status: Widowed    Spouse name: Not on file   Number of children: 2   Years of education: Not on file   Highest education level: Not on file  Occupational History   Not on file  Tobacco Use   Smoking status: Former    Types: Cigarettes   Smokeless tobacco: Never  Substance and Sexual Activity   Alcohol use: No   Drug use: No   Sexual activity: Not Currently  Other Topics Concern   Not on file  Social History Narrative   Widowed 03/2018.   Social Determinants of Health   Financial Resource Strain: Medium Risk   Difficulty of Paying  Living Expenses: Somewhat hard  Food Insecurity: No Food Insecurity   Worried About Programme researcher, broadcasting/film/video in the Last Year: Never true   Ran Out of Food in the Last Year: Never true  Transportation Needs: No Transportation Needs   Lack of Transportation (Medical): No   Lack of Transportation (Non-Medical): No  Physical Activity: Sufficiently Active   Days of Exercise per Week: 3 days   Minutes of Exercise per Session: 60 min  Stress: No Stress Concern Present   Feeling of Stress : Only a little  Social Connections: Unknown   Frequency of Communication with Friends and Family: More than three times a week   Frequency of Social Gatherings with Friends and Family: Twice a week   Attends Religious Services: Not on Scientist, clinical (histocompatibility and immunogenetics) or Organizations: No   Attends Banker Meetings: Not on file   Marital Status: Widowed  Catering manager Violence: Not At Risk   Fear of Current or Ex-Partner: No   Emotionally Abused: No   Physically Abused: No   Sexually Abused: No   History reviewed. No pertinent family history. Father has a history of atrial fibrillation and prostate cancer.  Mother died due to old age.  He does have a family history of familial lipomatosis  Review of Systems  All other systems reviewed and are negative.     Objective:   Physical Exam Vitals reviewed.  Constitutional:      General: He is not in acute distress.    Appearance: Normal appearance. He is obese. He is not ill-appearing, toxic-appearing or diaphoretic.  HENT:     Head: Normocephalic and atraumatic.     Right Ear: Tympanic membrane, ear canal and external ear normal. There is no impacted cerumen.     Left Ear: Tympanic membrane, ear canal and external ear normal.     Nose: Nose normal. No congestion or rhinorrhea.     Mouth/Throat:     Mouth: Mucous membranes are moist.     Pharynx: Pharyngeal swelling and uvula swelling present. No oropharyngeal exudate.   Eyes:     General:  No scleral icterus.       Right eye: No discharge.        Left eye: No discharge.     Extraocular Movements: Extraocular movements intact.     Conjunctiva/sclera: Conjunctivae normal.     Pupils: Pupils are equal, round, and reactive to light.  Neck:     Vascular: No carotid bruit.  Cardiovascular:     Rate and Rhythm: Normal rate and regular rhythm.     Pulses: Normal pulses.     Heart sounds: Normal heart sounds. No murmur heard.   No friction rub. No gallop.  Pulmonary:     Effort: Pulmonary effort is normal. No respiratory distress.     Breath sounds: Normal breath sounds. No stridor. No wheezing, rhonchi or rales.  Chest:     Chest wall: No tenderness.  Abdominal:     General: Abdomen is flat. Bowel sounds are normal. There is no distension.     Palpations: Abdomen is soft. There is no mass.     Tenderness: There is no abdominal tenderness. There is no right CVA tenderness, left CVA tenderness, guarding or rebound.     Hernia: No hernia is present.  Musculoskeletal:        General: No swelling, tenderness, deformity or signs of injury.     Cervical back: Normal range of motion and neck supple. No rigidity or tenderness.     Right lower leg: No edema.     Left lower leg: No edema.  Lymphadenopathy:     Cervical: No cervical adenopathy.  Skin:    General: Skin is warm.     Capillary Refill: Capillary refill takes less than 2 seconds.     Coloration: Skin is not jaundiced or pale.     Findings: No bruising, erythema, lesion or rash.  Neurological:     General: No focal deficit present.     Mental Status: He is alert and oriented to person, place, and time. Mental status is at baseline.     Cranial Nerves: No cranial nerve deficit.     Sensory: No sensory deficit.     Motor: No weakness.     Coordination: Coordination normal.     Gait: Gait normal.     Deep Tendon Reflexes: Reflexes normal.  Psychiatric:        Mood and Affect: Mood normal.        Behavior: Behavior  normal.        Thought Content: Thought content normal.        Judgment: Judgment normal.          Assessment & Plan:  Need for immunization against influenza - Plan: Flu Vaccine QUAD  High Dose(Fluad)  Chronic fatigue - Plan: CBC with Differential/Platelet, COMPLETE METABOLIC PANEL WITH GFR, TSH, PSA, Sedimentation rate, Vitamin B12, Testosterone Total,Free,Bio, Males  B12 deficiency - Plan: Vitamin B12 I suspect this is likely age-related.  I will check a CBC to rule out bone marrow issues or anemia.  Check a CMP to rule out liver or kidney disease.  Check a TSH to rule out hyper or hypothyroidism.  Check a PSA to screen for prostate cancer.  Check a sedimentation rate to evaluate for any evidence of autoimmune disease.  Check a testosterone level and a B12 level.  If all this is normal I would next recommend a sleep study

## 2020-12-26 LAB — CBC WITH DIFFERENTIAL/PLATELET
Absolute Monocytes: 490 cells/uL (ref 200–950)
Basophils Absolute: 52 cells/uL (ref 0–200)
Basophils Relative: 1.2 %
Eosinophils Absolute: 60 cells/uL (ref 15–500)
Eosinophils Relative: 1.4 %
HCT: 45.3 % (ref 38.5–50.0)
Hemoglobin: 14.7 g/dL (ref 13.2–17.1)
Lymphs Abs: 1428 cells/uL (ref 850–3900)
MCH: 30.8 pg (ref 27.0–33.0)
MCHC: 32.5 g/dL (ref 32.0–36.0)
MCV: 94.8 fL (ref 80.0–100.0)
MPV: 11.3 fL (ref 7.5–12.5)
Monocytes Relative: 11.4 %
Neutro Abs: 2270 cells/uL (ref 1500–7800)
Neutrophils Relative %: 52.8 %
Platelets: 199 10*3/uL (ref 140–400)
RBC: 4.78 10*6/uL (ref 4.20–5.80)
RDW: 12.5 % (ref 11.0–15.0)
Total Lymphocyte: 33.2 %
WBC: 4.3 10*3/uL (ref 3.8–10.8)

## 2020-12-26 LAB — COMPLETE METABOLIC PANEL WITH GFR
AG Ratio: 2 (calc) (ref 1.0–2.5)
ALT: 17 U/L (ref 9–46)
AST: 19 U/L (ref 10–35)
Albumin: 4.3 g/dL (ref 3.6–5.1)
Alkaline phosphatase (APISO): 60 U/L (ref 35–144)
BUN: 17 mg/dL (ref 7–25)
CO2: 24 mmol/L (ref 20–32)
Calcium: 9.2 mg/dL (ref 8.6–10.3)
Chloride: 106 mmol/L (ref 98–110)
Creat: 1 mg/dL (ref 0.70–1.28)
Globulin: 2.2 g/dL (calc) (ref 1.9–3.7)
Glucose, Bld: 105 mg/dL — ABNORMAL HIGH (ref 65–99)
Potassium: 4 mmol/L (ref 3.5–5.3)
Sodium: 140 mmol/L (ref 135–146)
Total Bilirubin: 0.8 mg/dL (ref 0.2–1.2)
Total Protein: 6.5 g/dL (ref 6.1–8.1)
eGFR: 78 mL/min/{1.73_m2} (ref >=60)

## 2020-12-26 LAB — VITAMIN B12: Vitamin B-12: 645 pg/mL (ref 200–1100)

## 2020-12-26 LAB — TESTOSTERONE TOTAL,FREE,BIO, MALES
Albumin: 4.3 g/dL (ref 3.6–5.1)
Sex Hormone Binding: 35 nmol/L (ref 22–77)
Testosterone, Bioavailable: 83.7 ng/dL (ref 15.0–150.0)
Testosterone, Free: 42.5 pg/mL (ref 6.0–73.0)
Testosterone: 343 ng/dL (ref 250–827)

## 2020-12-26 LAB — SEDIMENTATION RATE: Sed Rate: 2 mm/h (ref 0–20)

## 2020-12-26 LAB — PSA: PSA: 2.33 ng/mL (ref <=4.00)

## 2020-12-26 LAB — TSH: TSH: 2.57 mIU/L (ref 0.40–4.50)

## 2021-04-03 ENCOUNTER — Ambulatory Visit (INDEPENDENT_AMBULATORY_CARE_PROVIDER_SITE_OTHER): Payer: Medicare Other | Admitting: Nurse Practitioner

## 2021-04-03 ENCOUNTER — Other Ambulatory Visit: Payer: Self-pay

## 2021-04-03 ENCOUNTER — Encounter: Payer: Self-pay | Admitting: Nurse Practitioner

## 2021-04-03 VITALS — BP 134/70 | Ht 74.0 in | Wt 255.6 lb

## 2021-04-03 DIAGNOSIS — R42 Dizziness and giddiness: Secondary | ICD-10-CM

## 2021-04-03 DIAGNOSIS — N5201 Erectile dysfunction due to arterial insufficiency: Secondary | ICD-10-CM

## 2021-04-03 DIAGNOSIS — G8929 Other chronic pain: Secondary | ICD-10-CM

## 2021-04-03 DIAGNOSIS — M25511 Pain in right shoulder: Secondary | ICD-10-CM | POA: Diagnosis not present

## 2021-04-03 MED ORDER — SILDENAFIL CITRATE 50 MG PO TABS: 50.0000 mg | ORAL_TABLET | Freq: Every day | ORAL | 0 refills | Status: DC | PRN

## 2021-04-03 MED ORDER — MELOXICAM 7.5 MG PO TABS: 7.5000 mg | ORAL_TABLET | Freq: Every day | ORAL | 0 refills | Status: DC | PRN

## 2021-04-03 NOTE — Progress Notes (Signed)
Subjective:    Patient ID: Jose Owen, male    DOB: 18-Sep-1940, 81 y.o.   MRN: [id number]  HPI: Jose Owen is a 81 y.o. male presenting with multiple concerns.  Chief Complaint  Patient presents with   Shoulder Pain    Pain in the r-shoulder, hard time raising up, must use left hand to do stuff.   Dizziness   SHOULDER PAIN Duration: chronic - months to years Involved shoulder: right Mechanism of injury: unknown Location: diffuse Onset: sudden with movement Severity: 5/10-severe with movement  Quality: dull Frequency: intermittent with activity Radiation: no Aggravating factors: movement, sleeping on arm  Alleviating factors: Tylenol  Status: worse over time Treatments attempted: Tylenol Relief with NSAIDs?:  some Weakness: no Numbness: no Decreased grip strength: no Redness: no Swelling: no Bruising: no Fevers: no  ERECTILE DYSFUNCTION Patient reports tadalafil is not working for him. Recent PSA and testosterone were normal. Erectile Dysfunction status: stable Current treatment: tadalafil  Satisfied with current treatment: no Side effects: no Duration: chronic Struggles with maintaining erection: yes Fevers:  no Hematuria: no  Urinary incontinence: no Chest pain:  no Shortness of breath: no  Patient also reports he woke up with dizziness this morning.  He first noticed it when he rolled over in bed.  Has a history of vertigo.  Worse when his body is moving, feels like he is spinning.  Reports this is more mild than it has been in the past. He took Claritin this morning and reports this helped.    No Known Allergies  Outpatient Encounter Medications as of 04/03/2021  Medication Sig   acetaminophen (TYLENOL) 500 MG tablet Take 500 mg by mouth as needed.   loratadine (CLARITIN) 10 MG tablet Take 10 mg by mouth as needed for allergies.   MECLIZINE HCL PO Take by mouth as needed.   meloxicam (MOBIC) 7.5 MG tablet Take 1 tablet (7.5 mg total) by mouth daily  as needed for pain.   sildenafil (VIAGRA) 50 MG tablet Take 1 tablet (50 mg total) by mouth daily as needed for erectile dysfunction.   tamsulosin (FLOMAX) 0.4 MG CAPS capsule TAKE 1 CAPSULE BY MOUTH EVERY DAY   vitamin B-12 (CYANOCOBALAMIN) 1000 MCG tablet Take 1 tablet (1,000 mcg total) by mouth daily.   [DISCONTINUED] tadalafil (CIALIS) 20 MG tablet Take 0.5-1 tablets (10-20 mg total) by mouth daily as needed for erectile dysfunction.   No facility-administered encounter medications on file as of 04/03/2021.    Patient Active Problem List   Diagnosis Date Noted   Acute pain of right knee 09/02/2020   BPH (benign prostatic hyperplasia)    Fatigue 12/05/2016    Past Medical History:  Diagnosis Date   BPH (benign prostatic hyperplasia)     Relevant past medical, surgical, family and social history reviewed and updated as indicated. Interim medical history since our last visit reviewed.  Review of Systems Per HPI unless specifically indicated above     Objective:    BP 134/70    Ht 6\' 2"  (1.88 m)    Wt 255 lb 9.6 oz (115.9 kg)    SpO2 95%    BMI 32.82 kg/m   Wt Readings from Last 3 Encounters:  04/03/21 255 lb 9.6 oz (115.9 kg)  12/25/20 256 lb (116.1 kg)  12/21/20 253 lb (114.8 kg)    Physical Exam Vitals and nursing note reviewed.  Constitutional:      General: He is not in acute distress.  Appearance: Normal appearance. He is not toxic-appearing.  Eyes:     General: No scleral icterus.    Extraocular Movements: Extraocular movements intact.  Cardiovascular:     Rate and Rhythm: Normal rate and regular rhythm.     Heart sounds: Normal heart sounds. No murmur heard. Pulmonary:     Effort: Pulmonary effort is normal. No respiratory distress.     Breath sounds: Normal breath sounds. No wheezing, rhonchi or rales.  Genitourinary:    Comments: deferred Musculoskeletal:     Right shoulder: No swelling, deformity, effusion, tenderness, bony tenderness or crepitus.  Normal range of motion. Normal strength. Normal pulse.     Comments: Painful passive ROM right shoulder, no tenderness to palpation  Skin:    General: Skin is warm and dry.     Capillary Refill: Capillary refill takes less than 2 seconds.     Coloration: Skin is not jaundiced or pale.     Findings: No erythema.  Neurological:     Mental Status: He is alert and oriented to person, place, and time.     Motor: No weakness.     Gait: Gait normal.  Psychiatric:        Mood and Affect: Mood normal.        Behavior: Behavior normal.        Thought Content: Thought content normal.        Judgment: Judgment normal.      Assessment & Plan:  1. Chronic right shoulder pain Acute on chronic.  X-ray of right shoulder in 2010 showed glenohumeral degenerative joint disease.  No red flags in history or on examination.  Likely osteoarthritis cause.  Discussed use of NSAIDs as needed and physical therapy.  Patient prefers to work on exercises at home and will let us know if he desires referral to formal PT in the future.   - meloxicam (MOBIC) 7.5 MG tablet; Take 1 tablet (7.5 mg total) by mouth daily as needed for pain.  Dispense: 30 tablet; Refill: 0  2. Erectile dysfunction due to arterial insufficiency Chronic.  Recent PSA stable.  Change tadalafil to sildenafil and monitor for improvement.  We also discussed daily low dose tadalafil.  Follow up with PCP with no improvement.     - sildenafil (VIAGRA) 50 MG tablet; Take 1 tablet (50 mg total) by mouth daily as needed for erectile dysfunction.  Dispense: 10 tablet; Refill: 0  3. Vertigo Acute on chronic.  No red flags today and vital signs are stable.  I encouraged patient to take meclizine.  Follow up if symptoms do not improve.     Follow up plan: Return if symptoms worsen or fail to improve.

## 2021-04-17 ENCOUNTER — Other Ambulatory Visit: Payer: Self-pay | Admitting: Family Medicine

## 2021-05-03 ENCOUNTER — Ambulatory Visit (INDEPENDENT_AMBULATORY_CARE_PROVIDER_SITE_OTHER): Payer: Medicare Other | Admitting: Family Medicine

## 2021-05-03 ENCOUNTER — Encounter: Payer: Self-pay | Admitting: Family Medicine

## 2021-05-03 ENCOUNTER — Other Ambulatory Visit: Payer: Self-pay

## 2021-05-03 VITALS — BP 148/82 | HR 81 | Temp 97.5°F | Resp 18 | Ht 74.0 in | Wt 257.0 lb

## 2021-05-03 DIAGNOSIS — R030 Elevated blood-pressure reading, without diagnosis of hypertension: Secondary | ICD-10-CM

## 2021-05-03 DIAGNOSIS — R42 Dizziness and giddiness: Secondary | ICD-10-CM

## 2021-05-03 NOTE — Progress Notes (Signed)
Subjective:    Patient ID: Jose Owen, male    DOB: [birth year], 81 y.o.   MRN: [id number]  HPI For the last 2 weeks, the patient has been experiencing dizziness.  He states that when he sits up in bed, the room will start spinning or move.  If he sits still it will calm down and stop.  If he bends over to look in the oven, the the room will start to spin or move.  He holds still it.Marland Kitchen.  He denies any tinnitus.  He denies any sudden hearing loss.  He denies any headache.  He denies any head trauma.  He denies any neurologic deficits.  His blood pressure is elevated today at 148/82 Past Medical History:  Diagnosis Date   BPH (benign prostatic hyperplasia)    Past Surgical History:  Procedure Laterality Date   SKIN CANCER EXCISION  10/2020   right hand   Current Outpatient Medications on File Prior to Visit  Medication Sig Dispense Refill   acetaminophen (TYLENOL) 500 MG tablet Take 500 mg by mouth as needed.     loratadine (CLARITIN) 10 MG tablet Take 10 mg by mouth as needed for allergies.     MECLIZINE HCL PO Take by mouth as needed.     meloxicam (MOBIC) 7.5 MG tablet Take 1 tablet (7.5 mg total) by mouth daily as needed for pain. 30 tablet 0   sildenafil (VIAGRA) 50 MG tablet Take 1 tablet (50 mg total) by mouth daily as needed for erectile dysfunction. 10 tablet 0   tamsulosin (FLOMAX) 0.4 MG CAPS capsule TAKE 1 CAPSULE BY MOUTH EVERY DAY 90 capsule 1   vitamin B-12 (CYANOCOBALAMIN) 1000 MCG tablet Take 1 tablet (1,000 mcg total) by mouth daily.     No current facility-administered medications on file prior to visit.   No Known Allergies Social History   Socioeconomic History   Marital status: Widowed    Spouse name: Not on file   Number of children: 2   Years of education: Not on file   Highest education level: Not on file  Occupational History   Not on file  Tobacco Use   Smoking status: Former    Packs/day: 0.50    Types: Cigarettes    Start date: 03/31/1962    Quit  date: 04/01/1971    Years since quitting: 50.1   Smokeless tobacco: Never  Substance and Sexual Activity   Alcohol use: No   Drug use: No   Sexual activity: Not Currently  Other Topics Concern   Not on file  Social History Narrative   Widowed 03/2018.   Social Determinants of Health   Financial Resource Strain: Medium Risk   Difficulty of Paying Living Expenses: Somewhat hard  Food Insecurity: No Food Insecurity   Worried About Programme researcher, broadcasting/film/videounning Out of Food in the Last Year: Never true   Ran Out of Food in the Last Year: Never true  Transportation Needs: No Transportation Needs   Lack of Transportation (Medical): No   Lack of Transportation (Non-Medical): No  Physical Activity: Sufficiently Active   Days of Exercise per Week: 3 days   Minutes of Exercise per Session: 60 min  Stress: No Stress Concern Present   Feeling of Stress : Only a little  Social Connections: Unknown   Frequency of Communication with Friends and Family: More than three times a week   Frequency of Social Gatherings with Friends and Family: Twice a week   Attends Religious Services: Not on  file   Active Member of Clubs or Organizations: No   Attends Banker Meetings: Not on file   Marital Status: Widowed  Catering manager Violence: Not At Risk   Fear of Current or Ex-Partner: No   Emotionally Abused: No   Physically Abused: No   Sexually Abused: No   History reviewed. No pertinent family history. Father has a history of atrial fibrillation and prostate cancer.  Mother died due to old age.  He does have a family history of familial lipomatosis  Review of Systems  All other systems reviewed and are negative.     Objective:   Physical Exam Vitals reviewed.  Constitutional:      General: He is not in acute distress.    Appearance: Normal appearance. He is obese. He is not ill-appearing, toxic-appearing or diaphoretic.  HENT:     Head: Normocephalic and atraumatic.     Right Ear: Tympanic  membrane, ear canal and external ear normal. There is no impacted cerumen.     Left Ear: Tympanic membrane, ear canal and external ear normal.     Nose: Nose normal. No congestion or rhinorrhea.     Mouth/Throat:     Mouth: Mucous membranes are moist.     Pharynx: Pharyngeal swelling and uvula swelling present. No oropharyngeal exudate.   Eyes:     General: No scleral icterus.       Right eye: No discharge.        Left eye: No discharge.     Extraocular Movements: Extraocular movements intact.     Conjunctiva/sclera: Conjunctivae normal.     Pupils: Pupils are equal, round, and reactive to light.  Neck:     Vascular: No carotid bruit.  Cardiovascular:     Rate and Rhythm: Normal rate and regular rhythm.     Pulses: Normal pulses.     Heart sounds: Normal heart sounds. No murmur heard.   No friction rub. No gallop.  Pulmonary:     Effort: Pulmonary effort is normal. No respiratory distress.     Breath sounds: Normal breath sounds. No stridor. No wheezing, rhonchi or rales.  Chest:     Chest wall: No tenderness.  Abdominal:     General: Abdomen is flat. Bowel sounds are normal. There is no distension.     Palpations: Abdomen is soft. There is no mass.     Tenderness: There is no abdominal tenderness. There is no right CVA tenderness, left CVA tenderness, guarding or rebound.     Hernia: No hernia is present.  Genitourinary:    Prostate: Enlarged. Not tender and no nodules present.  Musculoskeletal:        General: No swelling, tenderness, deformity or signs of injury.     Cervical back: Normal range of motion and neck supple. No rigidity or tenderness.     Right lower leg: No edema.     Left lower leg: No edema.  Lymphadenopathy:     Cervical: No cervical adenopathy.  Skin:    General: Skin is warm.     Capillary Refill: Capillary refill takes less than 2 seconds.     Coloration: Skin is not jaundiced or pale.     Findings: No bruising, erythema, lesion or rash.   Neurological:     General: No focal deficit present.     Mental Status: He is alert and oriented to person, place, and time. Mental status is at baseline.     Cranial Nerves: No cranial nerve  deficit.     Sensory: No sensory deficit.     Motor: No weakness.     Coordination: Coordination normal.     Gait: Gait normal.     Deep Tendon Reflexes: Reflexes normal.  Psychiatric:        Mood and Affect: Mood normal.        Behavior: Behavior normal.        Thought Content: Thought content normal.        Judgment: Judgment normal.          Assessment & Plan:  Vertigo  Elevated blood pressure reading Patient is having BPPV.  We discussed the natural history and treatment strategies.  At the current time it seems to be getting better.  We will allow tincture of time to see if it resolves spontaneously on its own for pursuing Epley maneuver.  Blood pressure is elevated.  Patient will check his blood pressure 2-3 times a day over the next week and call me with the values.  If persistently greater than 140/90, I would recommend adding hydrochlorothiazide.

## 2021-05-10 ENCOUNTER — Telehealth: Payer: Self-pay

## 2021-05-10 NOTE — Telephone Encounter (Signed)
Spoke with pt and advised. Nothing further needed at this time.  ° °

## 2021-05-10 NOTE — Telephone Encounter (Signed)
LMTRC

## 2021-05-10 NOTE — Telephone Encounter (Signed)
Pt called to report recent BP readings: 141/79 138/70 139/82 137/80 135/67 139/81 138/78 131/84  Pt would like to know if you have any additional recommendations. Thank you!

## 2021-05-21 ENCOUNTER — Other Ambulatory Visit: Payer: Self-pay | Admitting: Nurse Practitioner

## 2021-05-21 DIAGNOSIS — G8929 Other chronic pain: Secondary | ICD-10-CM

## 2021-05-28 NOTE — Progress Notes (Signed)
PATIENT: Jose Owen, Jose Owen  REASON FOR VISIT: follow up HISTORY FROM: patient  Chief Complaint  Patient presents with   Obstructive Sleep Apnea    Rm 2, alone. Here for 6 month CPAP f/u. Pt would like to try a new mask. Pt c/o dry mouth, having to wake up middle of night to take a sip of water.       HISTORY OF PRESENT ILLNESS: 05/29/21 ALL:  Jose Owen is a 81 y.o. male here today for follow up for OSA on CPAP. He continues to have dry mouth. He wakes multiple times at night  to drink water. He is interested in trying a different mask to see if this Maheu help. His compliance report below shows excellent compliance for days worn and for greater tan 4 hours. His apnea is well managed on a set pressure of 10 cmH20 with an AHI of 1.8.     11/28/2020 ALL:  Jose Owen is a 81 y.o. male here today for follow up for OSA on CPAP.  He has continued to work on improving compliance. He admits that he does not always take his machine with him if he is out of town or visiting his dad. He continues to have dry mouth. He is using a FFM. He is interested in trying a different mask to see if this Wingler help. He wakes 2-3 times at night and needs to drink water. He reports that DME adjusted humidity but he has not tried to adjust again at home. Otherwise, he is doing well.     HISTORY: (copied from Dr Guadelupe Sabin previous note)  Jose Owen is a 81 year old right-handed gentleman with an underlying medical history of low back pain, and mild obesity, who presents for follow-up consultation of his obstructive sleep apnea after interim testing and starting CPAP therapy.  The patient is unaccompanied today.  I first met him at the request of his primary care physician on 08/04/2019, at which time he reported snoring and excessive daytime somnolence.  He also had a history of low testosterone.  He was advised to proceed with sleep testing.  He had a baseline sleep study followed by a CPAP titration study.   Baseline sleep study from 11/21/2019 showed a sleep efficiency of 82.5%, sleep latency 6 minutes, REM latency 67 minutes.  He had an increased percentage of stage II sleep, and a reduced percentage of REM sleep at 9.5%.  He had 29 obstructive and 13 central apneas, 130 hypopneas for the night.  Total AHI was 30.5/h, REM AHI 39.4/h, supine AHI 32.7/h and O2 nadir 82%.  He was advised to proceed with a full night CPAP titration study, which he had on 12/19/2019. Sleep latency was 8 minutes, REM latency 43 minutes, sleep efficiency 86.4%, he had a normal percentage of stage I and stage II sleep, and increased percentage of slow-wave sleep and a near normal percentage of REM sleep at 17.3%.  He was fitted with a large full facemask after trying a nasal mask.  CPAP was titrated from 5 cm to 10 cm.  On the final pressure his AHI was 0.8/h, with nonsupine REM sleep achieved and O2 nadir of 91%.  Based on the test results I prescribed CPAP therapy for home use.  Set up date was 03/15/2020.    Today, 05/10/20: I reviewed his CPAP compliance data from 04/09/2020 through 05/12/2020, which is a total of 30 days, during which time he used his machine Owen  days with percent use days greater than 4 hours at 57%, indicating mildly suboptimal compliance, average usage of 4 hours and 34 minutes, residual AHI at goal at 1.8/h, leak on the low side with a 95th percentile at 3.7 L/min, pressure of 10 cm with EPR of 3.  In the month of early January through early February 20 04/05/2020 and 05/04/2020 his percent use days greater than 4 hours was slightly better at 60%, still suboptimal.  He reports overall doing fairly well but he does have significant issues with mouth dryness.  He called his DME provider at least twice, the temperature was first turned down to 74 degrees and then to room air but he still wakes up with severely dry mouth.  He has to sip water in the middle of the night and sometimes he just gets too frustrated and does not put  it back on.  He did go to [place] for his grandsons wedding and missed 2 days.  He is willing to continue with treatment and actually has already noticed some benefit and that his nocturia is better and daytime energy are better.  He is motivated to continue with treatment and working on compliance.  He is using a large Simplus full facemask without any problems.  Chinstrap was given to him but it does not fit and he does not actually need it.    The patient's allergies, current medications, family history, past medical history, past social history, past surgical history and problem list were reviewed and updated as appropriate.   REVIEW OF SYSTEMS: Out of a complete 14 system review of symptoms, the patient complains only of the following symptoms, dry mouth and all other reviewed systems are negative.  ESS: 12  ALLERGIES: No Known Allergies  HOME MEDICATIONS: Outpatient Medications Prior to Visit  Medication Sig Dispense Refill   acetaminophen (TYLENOL) 500 MG tablet Take 500 mg by mouth as needed.     loratadine (CLARITIN) 10 MG tablet Take 10 mg by mouth as needed for allergies.     MECLIZINE HCL PO Take by mouth as needed.     meloxicam (MOBIC) 7.5 MG tablet TAKE 1 TABLET BY MOUTH DAILY AS NEEDED FOR PAIN 30 tablet 0   sildenafil (VIAGRA) 50 MG tablet Take 1 tablet (50 mg total) by mouth daily as needed for erectile dysfunction. 10 tablet 0   tamsulosin (FLOMAX) 0.4 MG CAPS capsule TAKE 1 CAPSULE BY MOUTH EVERY DAY 90 capsule 1   vitamin B-12 (CYANOCOBALAMIN) 1000 MCG tablet Take 1 tablet (1,000 mcg total) by mouth daily.     No facility-administered medications prior to visit.    PAST MEDICAL HISTORY: Past Medical History:  Diagnosis Date   BPH (benign prostatic hyperplasia)     PAST SURGICAL HISTORY: Past Surgical History:  Procedure Laterality Date   SKIN CANCER EXCISION  10/2020   right hand    FAMILY HISTORY: History reviewed. No pertinent family history.  SOCIAL  HISTORY: Social History   Socioeconomic History   Marital status: Widowed    Spouse name: Not on file   Number of children: 2   Years of education: Not on file   Highest education level: Not on file  Occupational History   Not on file  Tobacco Use   Smoking status: Former    Packs/day: 0.50    Types: Cigarettes    Start date: 03/31/1962    Quit date: 04/01/1971    Years since quitting: 50.1   Smokeless tobacco: Never  Substance and Sexual Activity   Alcohol use: No   Drug use: No   Sexual activity: Not Currently  Other Topics Concern   Not on file  Social History Narrative   Widowed 03/2018.   Social Determinants of Health   Financial Resource Strain: Medium Risk   Difficulty of Paying Living Expenses: Somewhat hard  Food Insecurity: No Food Insecurity   Worried About Charity fundraiser in the Last Year: Never true   Ran Out of Food in the Last Year: Never true  Transportation Needs: No Transportation Needs   Lack of Transportation (Medical): No   Lack of Transportation (Non-Medical): No  Physical Activity: Sufficiently Active   Days of Exercise per Week: 3 days   Minutes of Exercise per Session: 60 min  Stress: No Stress Concern Present   Feeling of Stress : Only a little  Social Connections: Unknown   Frequency of Communication with Friends and Family: More than three times a week   Frequency of Social Gatherings with Friends and Family: Twice a week   Attends Religious Services: Not on Electrical engineer or Organizations: No   Attends Archivist Meetings: Not on file   Marital Status: Widowed  Intimate Partner Violence: Not At Risk   Fear of Current or Ex-Partner: No   Emotionally Abused: No   Physically Abused: No   Sexually Abused: No     PHYSICAL EXAM  Vitals:   05/29/21 0738  BP: 138/69  Pulse: 74  Weight: 255 lb (115.7 kg)  Height: 6' 2"  (1.88 m)    Body mass index is 32.74 kg/m.  Generalized: Well developed, in no acute  distress  Cardiology: normal rate and rhythm, no murmur noted Respiratory: clear to auscultation bilaterally  Neurological examination  Mentation: Alert oriented to time, place, history taking. Follows all commands speech and language fluent Cranial nerve II-XII: Pupils were equal round reactive to light. Extraocular movements were full, visual field were full  Motor: The motor testing reveals 5 over 5 strength of all 4 extremities. Good symmetric motor tone is noted throughout.  Gait and station: Gait is normal.    DIAGNOSTIC DATA (LABS, IMAGING, TESTING) - I reviewed patient records, labs, notes, testing and imaging myself where available.  No flowsheet data found.   Lab Results  Component Value Date   WBC 4.3 09/Owen/2022   HGB 14.7 09/Owen/2022   HCT 45.3 09/Owen/2022   MCV 94.8 09/Owen/2022   PLT 199 09/Owen/2022      Component Value Date/Time   NA 140 09/Owen/2022 0934   K 4.0 09/Owen/2022 0934   CL 106 09/Owen/2022 0934   CO2 24 09/Owen/2022 0934   GLUCOSE 105 (H) 09/Owen/2022 0934   BUN 17 09/Owen/2022 0934   CREATININE 1.00 09/Owen/2022 0934   CALCIUM 9.2 09/Owen/2022 0934   PROT 6.5 09/Owen/2022 0934   AST 19 09/Owen/2022 0934   ALT 17 09/Owen/2022 0934   BILITOT 0.8 09/Owen/2022 0934   GFRNONAA 81 07/22/2019 0938   GFRAA 93 07/22/2019 0938   Lab Results  Component Value Date   CHOL 164 07/22/2019   HDL 39 (L) 07/22/2019   LDLCALC 104 (H) 07/22/2019   TRIG 118 07/22/2019   CHOLHDL 4.2 07/22/2019   No results found for: HGBA1C Lab Results  Component Value Date   OZDGUYQI34 742 09/Owen/2022   Lab Results  Component Value Date   TSH 2.57 09/Owen/2022     ASSESSMENT AND PLAN 81 y.o. year old male  has a past medical history of BPH (benign prostatic hyperplasia). here with     ICD-10-CM   1. OSA on CPAP  G47.33 For home use only DME continuous positive airway pressure (CPAP)   Z99.89       Javarion Douty Rowles is doing fairly well on CPAP therapy. Compliance report reveals excellent daily  and acceptable four hour compliance. He continues to have difficulty with dry mouth. I will order a mask refitting per his request. I have advised he try Biotene procuts OTC as well as adjust humidity at home. He was encouraged to continue using CPAP nightly and for greater than 4 hours each night. Risks of untreated sleep apnea review and education materials provided. Healthy lifestyle habits encouraged. He will follow up in 1 year, sooner if needed. He verbalizes understanding and agreement with this plan.    Orders Placed This Encounter  Procedures   For home use only DME continuous positive airway pressure (CPAP)    Mask refitting and supplies    Order Specific Question:   Length of Need    Answer:   12 Months    Order Specific Question:   Patient has OSA or probable OSA    Answer:   Yes    Order Specific Question:   Is the patient currently using CPAP in the home    Answer:   Yes    Order Specific Question:   Settings    Answer:   Other see comments    Order Specific Question:   CPAP supplies needed    Answer:   Mask, headgear, cushions, filters, heated tubing and water chamber      No orders of the defined types were placed in this encounter.     Jose Presto, FNP-C 05/29/2021, 9:03 AM [facility] [address] [place], [place] [address] [phone]

## 2021-05-28 NOTE — Patient Instructions (Addendum)
Please continue using your CPAP regularly. While your insurance requires that you use CPAP at least 4 hours each night on 70% of the nights, I recommend, that you not skip any nights and use it throughout the night if you can. Getting used to CPAP and staying with the treatment long term does take time and patience and discipline. Untreated obstructive sleep apnea when it is moderate to severe can have an adverse impact on cardiovascular health and raise her risk for heart disease, arrhythmias, hypertension, congestive heart failure, stroke and diabetes. Untreated obstructive sleep apnea causes sleep disruption, nonrestorative sleep, and sleep deprivation. This can have an impact on your day to day functioning and cause daytime sleepiness and impairment of cognitive function, memory loss, mood disturbance, and problems focussing. Using CPAP regularly can improve these symptoms. ? ? ?We encouraged trying to increase the humidity on your CPAP to help with the dry mouth. If this is not helpful you can try an over the counter Biotene mouthrinse that is helpful in treating dry mouth.  ? ?Follow up in 1 year  ?

## 2021-05-29 ENCOUNTER — Encounter: Payer: Self-pay | Admitting: Family Medicine

## 2021-05-29 ENCOUNTER — Other Ambulatory Visit: Payer: Self-pay

## 2021-05-29 ENCOUNTER — Ambulatory Visit: Payer: Medicare Other | Admitting: Family Medicine

## 2021-05-29 VITALS — BP 138/69 | HR 74 | Ht 74.0 in | Wt 255.0 lb

## 2021-05-29 DIAGNOSIS — G4733 Obstructive sleep apnea (adult) (pediatric): Secondary | ICD-10-CM | POA: Diagnosis not present

## 2021-05-29 DIAGNOSIS — Z9989 Dependence on other enabling machines and devices: Secondary | ICD-10-CM

## 2021-05-29 NOTE — Progress Notes (Signed)
CM sent to [facility] for new order ?

## 2021-05-30 DIAGNOSIS — G4733 Obstructive sleep apnea (adult) (pediatric): Secondary | ICD-10-CM | POA: Diagnosis not present

## 2021-06-23 ENCOUNTER — Other Ambulatory Visit: Payer: Self-pay | Admitting: Family Medicine

## 2021-06-23 DIAGNOSIS — G8929 Other chronic pain: Secondary | ICD-10-CM

## 2021-07-25 ENCOUNTER — Telehealth: Payer: Self-pay | Admitting: Family Medicine

## 2021-07-25 ENCOUNTER — Other Ambulatory Visit: Payer: Self-pay | Admitting: Family Medicine

## 2021-07-25 MED ORDER — HYDROCODONE-ACETAMINOPHEN 5-325 MG PO TABS: 1.0000 | ORAL_TABLET | Freq: Four times a day (QID) | ORAL | 0 refills | Status: DC | PRN

## 2021-07-25 NOTE — Telephone Encounter (Signed)
Patient called in requesting something called in for back pain. I have offered an appointment for tomorrow however patient can come in. He has been taken tylenol for the pain but with the weekend coming up he wasn't sure if something could be called in. ? ?CB# [phone] ?

## 2021-07-26 ENCOUNTER — Other Ambulatory Visit: Payer: Self-pay | Admitting: Family Medicine

## 2021-07-26 ENCOUNTER — Telehealth: Payer: Self-pay

## 2021-07-26 DIAGNOSIS — G8929 Other chronic pain: Secondary | ICD-10-CM

## 2021-07-26 MED ORDER — OXYCODONE-ACETAMINOPHEN 7.5-325 MG PO TABS: 1.0000 | ORAL_TABLET | ORAL | 0 refills | Status: DC | PRN

## 2021-07-26 NOTE — Telephone Encounter (Signed)
Pt called in stating that his current pharmacy is out of this med HYDROcodone-acetaminophen (NORCO) 5-325 MG.Pt wanted to know if there is some other pain med that he could have sent in. Pt would like a cb when this is done please . Please advise. ? ?Cb#: [phone] ? ? ?

## 2021-07-26 NOTE — Telephone Encounter (Signed)
Per [facility] they have not received shipments of Norco since January. They do have Norco 10-325 and they also have Percocet in 5mg , 7.5mg  and 10mg .  ? ?Please advise, thanks! ?

## 2021-07-26 NOTE — Telephone Encounter (Signed)
Patient advised.

## 2021-08-24 ENCOUNTER — Other Ambulatory Visit: Payer: Self-pay | Admitting: Family Medicine

## 2021-08-24 DIAGNOSIS — G8929 Other chronic pain: Secondary | ICD-10-CM

## 2021-08-27 NOTE — Telephone Encounter (Signed)
Requested Prescriptions  Pending Prescriptions Disp Refills  . meloxicam (MOBIC) 7.5 MG tablet [Pharmacy Med Name: MELOXICAM 7.5 MG TABLET] 30 tablet 0    Sig: TAKE 1 TABLET BY MOUTH EVERY DAY AS NEEDED FOR PAIN     Analgesics:  COX2 Inhibitors Failed - 08/24/2021  1:24 AM      Failed - Manual Review: Labs are only required if the patient has taken medication for more than 8 weeks.      Passed - HGB in normal range and within 360 days    Hemoglobin  Date Value Ref Range Status  12/25/2020 14.7 13.2 - 17.1 g/dL Final         Passed - Cr in normal range and within 360 days    Creat  Date Value Ref Range Status  12/25/2020 1.00 0.70 - 1.28 mg/dL Final         Passed - HCT in normal range and within 360 days    HCT  Date Value Ref Range Status  12/25/2020 45.3 38.5 - 50.0 % Final         Passed - AST in normal range and within 360 days    AST  Date Value Ref Range Status  12/25/2020 19 10 - 35 U/L Final         Passed - ALT in normal range and within 360 days    ALT  Date Value Ref Range Status  12/25/2020 17 9 - 46 U/L Final         Passed - eGFR is 30 or above and within 360 days    GFR, Est African American  Date Value Ref Range Status  07/22/2019 93 > OR = 60 mL/min/1.66m Final   GFR, Est Non African American  Date Value Ref Range Status  07/22/2019 81 > OR = 60 mL/min/1.756mFinal   eGFR  Date Value Ref Range Status  12/25/2020 78 > OR = 60 mL/min/1.7370minal    Comment:    The eGFR is based on the CKD-EPI 2021 equation. To calculate  the new eGFR from a previous Creatinine or Cystatin C result, go to https://www.kidney.org/professionals/ kdoqi/gfr%5Fcalculator          Passed - Patient is not pregnant      Passed - Valid encounter within last 12 months    Recent Outpatient Visits          3 months ago VerBartowcSusy FrizzleD   4 months ago Chronic right shoulder pain   BroKimberlyrEulogio BearP   8 months ago Need for immunization against influenza   BroArcherarCammie McgeeD   9 months ago Visit for suture removal   BroMimsckard, WarCammie McgeeD   10 months ago Skin lesion of hand   BroCottonwoodckard, WarCammie McgeeD

## 2021-09-17 ENCOUNTER — Ambulatory Visit (INDEPENDENT_AMBULATORY_CARE_PROVIDER_SITE_OTHER): Payer: Medicare Other | Admitting: Family Medicine

## 2021-09-17 VITALS — BP 118/68 | HR 83 | Temp 97.9°F | Ht 74.0 in | Wt 254.0 lb

## 2021-09-17 DIAGNOSIS — M25511 Pain in right shoulder: Secondary | ICD-10-CM

## 2021-09-17 DIAGNOSIS — G8929 Other chronic pain: Secondary | ICD-10-CM | POA: Diagnosis not present

## 2021-09-17 NOTE — Progress Notes (Signed)
Subjective:    Patient ID: Jose Owen, male    DOB: September 16, 1940, 81 y.o.   MRN: [id number]  Patient reports several years of right shoulder pain.  He is unable to abduct his arm greater than 90 degrees.  It aches and throbs and the pain is relieved by meloxicam 18 mg daily.  Glenohumeral arthritis was seen on an x-ray in 2010.  He denies any recent injuries.  He has pain with abduction greater than 90 degrees even passively.  He has weakness with abduction greater than 90 degrees.  He has pain with internal and external rotation Past Medical History:  Diagnosis Date   BPH (benign prostatic hyperplasia)    Past Surgical History:  Procedure Laterality Date   SKIN CANCER EXCISION  10/2020   right hand   Current Outpatient Medications on File Prior to Visit  Medication Sig Dispense Refill   acetaminophen (TYLENOL) 500 MG tablet Take 500 mg by mouth as needed.     MECLIZINE HCL PO Take by mouth as needed.     meloxicam (MOBIC) 7.5 MG tablet TAKE 1 TABLET BY MOUTH EVERY DAY AS NEEDED FOR PAIN 30 tablet 0   oxyCODONE-acetaminophen (PERCOCET) 7.5-325 MG tablet Take 1 tablet by mouth every 4 (four) hours as needed for severe pain. 30 tablet 0   sildenafil (VIAGRA) 50 MG tablet Take 1 tablet (50 mg total) by mouth daily as needed for erectile dysfunction. 10 tablet 0   tamsulosin (FLOMAX) 0.4 MG CAPS capsule TAKE 1 CAPSULE BY MOUTH EVERY DAY 90 capsule 1   vitamin B-12 (CYANOCOBALAMIN) 1000 MCG tablet Take 1 tablet (1,000 mcg total) by mouth daily.     loratadine (CLARITIN) 10 MG tablet Take 10 mg by mouth as needed for allergies. (Patient not taking: Reported on 09/17/2021)     No current facility-administered medications on file prior to visit.   No Known Allergies Social History   Socioeconomic History   Marital status: Widowed    Spouse name: Not on file   Number of children: 2   Years of education: Not on file   Highest education level: Not on file  Occupational History   Not on  file  Tobacco Use   Smoking status: Former    Packs/day: 0.50    Types: Cigarettes    Start date: 03/31/1962    Quit date: 04/01/1971    Years since quitting: 50.4   Smokeless tobacco: Never  Substance and Sexual Activity   Alcohol use: No   Drug use: No   Sexual activity: Not Currently  Other Topics Concern   Not on file  Social History Narrative   Widowed 03/2018.   Social Determinants of Health   Financial Resource Strain: Medium Risk (12/21/2020)   Overall Financial Resource Strain (CARDIA)    Difficulty of Paying Living Expenses: Somewhat hard  Food Insecurity: No Food Insecurity (12/21/2020)   Hunger Vital Sign    Worried About Running Out of Food in the Last Year: Never true    Ran Out of Food in the Last Year: Never true  Transportation Needs: No Transportation Needs (12/21/2020)   PRAPARE - Administrator, Civil Service (Medical): No    Lack of Transportation (Non-Medical): No  Physical Activity: Sufficiently Active (12/21/2020)   Exercise Vital Sign    Days of Exercise per Week: 3 days    Minutes of Exercise per Session: 60 min  Stress: No Stress Concern Present (12/21/2020)   [employer] of Occupational  Health - Occupational Stress Questionnaire    Feeling of Stress : Only a little  Social Connections: Unknown (12/21/2020)   Social Connection and Isolation Panel [NHANES]    Frequency of Communication with Friends and Family: More than three times a week    Frequency of Social Gatherings with Friends and Family: Twice a week    Attends Religious Services: Not on Marketing executive or Organizations: No    Attends Banker Meetings: Not on file    Marital Status: Widowed  Intimate Partner Violence: Not At Risk (12/21/2020)   Humiliation, Afraid, Rape, and Kick questionnaire    Fear of Current or Ex-Partner: No    Emotionally Abused: No    Physically Abused: No    Sexually Abused: No   No family history on file. Father has a  history of atrial fibrillation and prostate cancer.  Mother died due to old age.  He does have a family history of familial lipomatosis  Review of Systems  All other systems reviewed and are negative.      Objective:   Physical Exam Vitals reviewed.  Constitutional:      General: He is not in acute distress.    Appearance: Normal appearance. He is obese. He is not ill-appearing, toxic-appearing or diaphoretic.  HENT:     Head: Normocephalic and atraumatic.     Right Ear: Tympanic membrane, ear canal and external ear normal. There is no impacted cerumen.     Left Ear: Tympanic membrane, ear canal and external ear normal.     Nose: Nose normal. No congestion or rhinorrhea.     Mouth/Throat:     Mouth: Mucous membranes are moist.     Pharynx: Pharyngeal swelling and uvula swelling present. No oropharyngeal exudate.   Eyes:     General: No scleral icterus.       Right eye: No discharge.        Left eye: No discharge.     Extraocular Movements: Extraocular movements intact.     Conjunctiva/sclera: Conjunctivae normal.     Pupils: Pupils are equal, round, and reactive to light.  Neck:     Vascular: No carotid bruit.  Cardiovascular:     Rate and Rhythm: Normal rate and regular rhythm.     Pulses: Normal pulses.     Heart sounds: Normal heart sounds. No murmur heard.    No friction rub. No gallop.  Pulmonary:     Effort: Pulmonary effort is normal. No respiratory distress.     Breath sounds: Normal breath sounds. No stridor. No wheezing, rhonchi or rales.  Chest:     Chest wall: No tenderness.  Abdominal:     General: Abdomen is flat. Bowel sounds are normal. There is no distension.     Palpations: Abdomen is soft. There is no mass.     Tenderness: There is no abdominal tenderness. There is no right CVA tenderness, left CVA tenderness, guarding or rebound.     Hernia: No hernia is present.  Genitourinary:    Prostate: Enlarged. Not tender and no nodules present.   Musculoskeletal:        General: No swelling, tenderness, deformity or signs of injury.     Right shoulder: Bony tenderness present. Decreased range of motion. Decreased strength.     Cervical back: Normal range of motion and neck supple. No rigidity or tenderness.     Right lower leg: No edema.     Left lower  leg: No edema.  Lymphadenopathy:     Cervical: No cervical adenopathy.  Skin:    General: Skin is warm.     Capillary Refill: Capillary refill takes less than 2 seconds.     Coloration: Skin is not jaundiced or pale.     Findings: No bruising, erythema, lesion or rash.  Neurological:     General: No focal deficit present.     Mental Status: He is alert and oriented to person, place, and time. Mental status is at baseline.     Cranial Nerves: No cranial nerve deficit.     Sensory: No sensory deficit.     Motor: No weakness.     Coordination: Coordination normal.     Gait: Gait normal.     Deep Tendon Reflexes: Reflexes normal.  Psychiatric:        Mood and Affect: Mood normal.        Behavior: Behavior normal.        Thought Content: Thought content normal.        Judgment: Judgment normal.           Assessment & Plan:  Chronic right shoulder pain I believe this is due to a combination of glenohumeral arthritis and likely tendinitis in the rotator cuff.  Using sterile technique, I injected the right subacromial space with 2 cc lidocaine, 2 cc of Marcaine, and 2 cc of 40 mg/mL Kenalog.  The patient tolerated the procedure well without complication.  If pain persist despite cortisone injection, I would recommend orthopedic consultation

## 2021-10-22 ENCOUNTER — Other Ambulatory Visit: Payer: Self-pay | Admitting: Family Medicine

## 2021-10-22 ENCOUNTER — Telehealth: Payer: Self-pay

## 2021-10-22 DIAGNOSIS — N5201 Erectile dysfunction due to arterial insufficiency: Secondary | ICD-10-CM

## 2021-10-22 MED ORDER — SILDENAFIL CITRATE 100 MG PO TABS: 100.0000 mg | ORAL_TABLET | Freq: Every day | ORAL | 5 refills | Status: DC | PRN

## 2021-10-22 NOTE — Telephone Encounter (Signed)
Pt states the last Rx he picked up for Viagra was filled with Cialis instead by the pharmacy. Pt states the Cialis is not working and would like an Rx for Viagra sent to [place] at [facility], [phone]. Thank you.

## 2022-02-02 IMAGING — CR DG KNEE COMPLETE 4+V*R*
4 series · 4 of 4 positions shown · non-contrast
Comparison: 04/05/2008

CLINICAL DATA: Acute knee pain

EXAM:
RIGHT KNEE - COMPLETE 4+ VIEW

[x knee sunrise right]
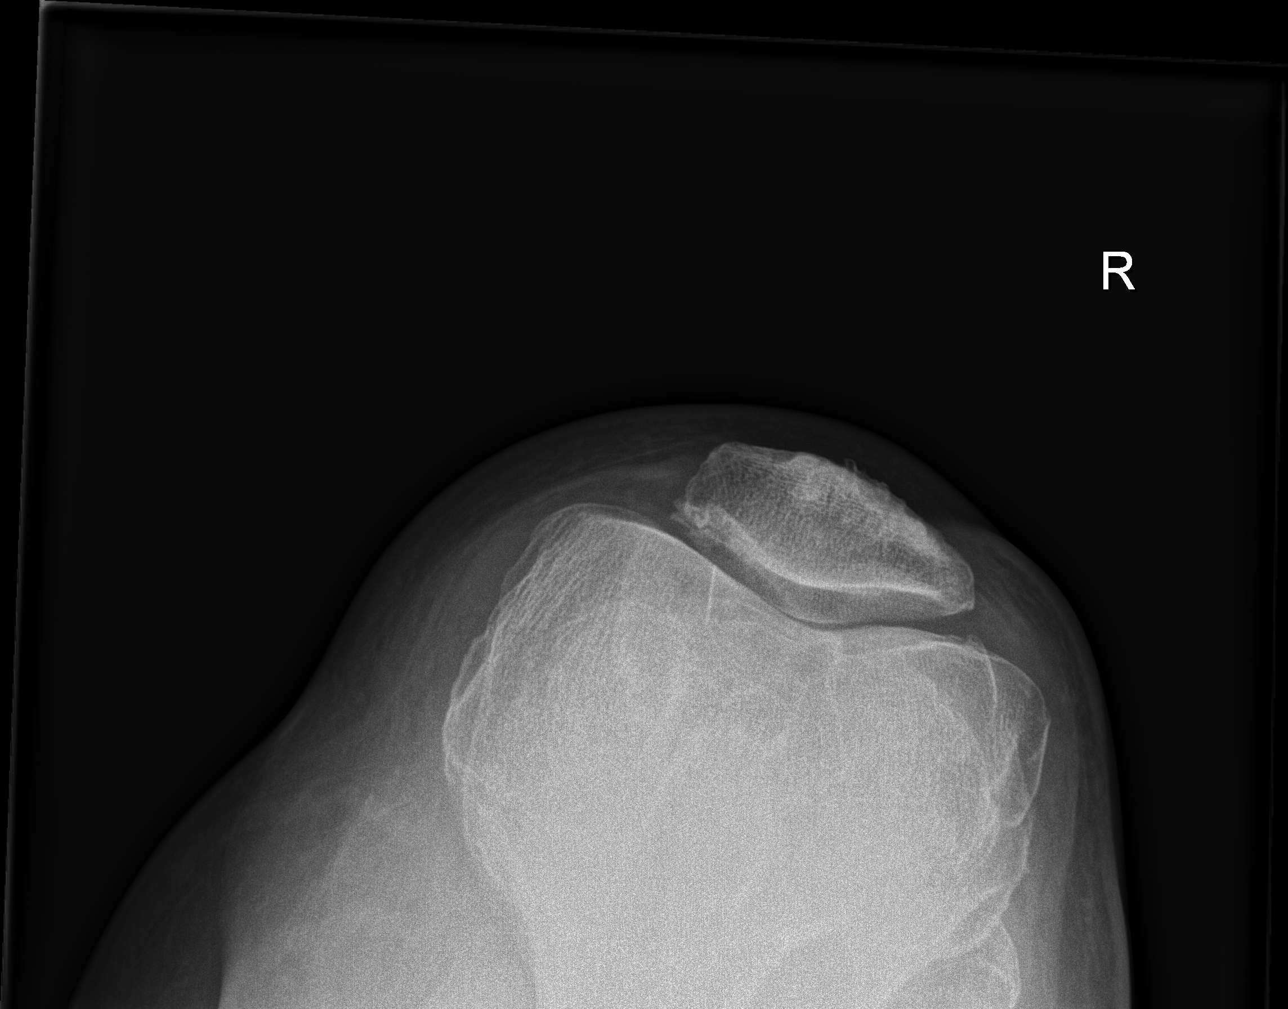

[w knee ap right]
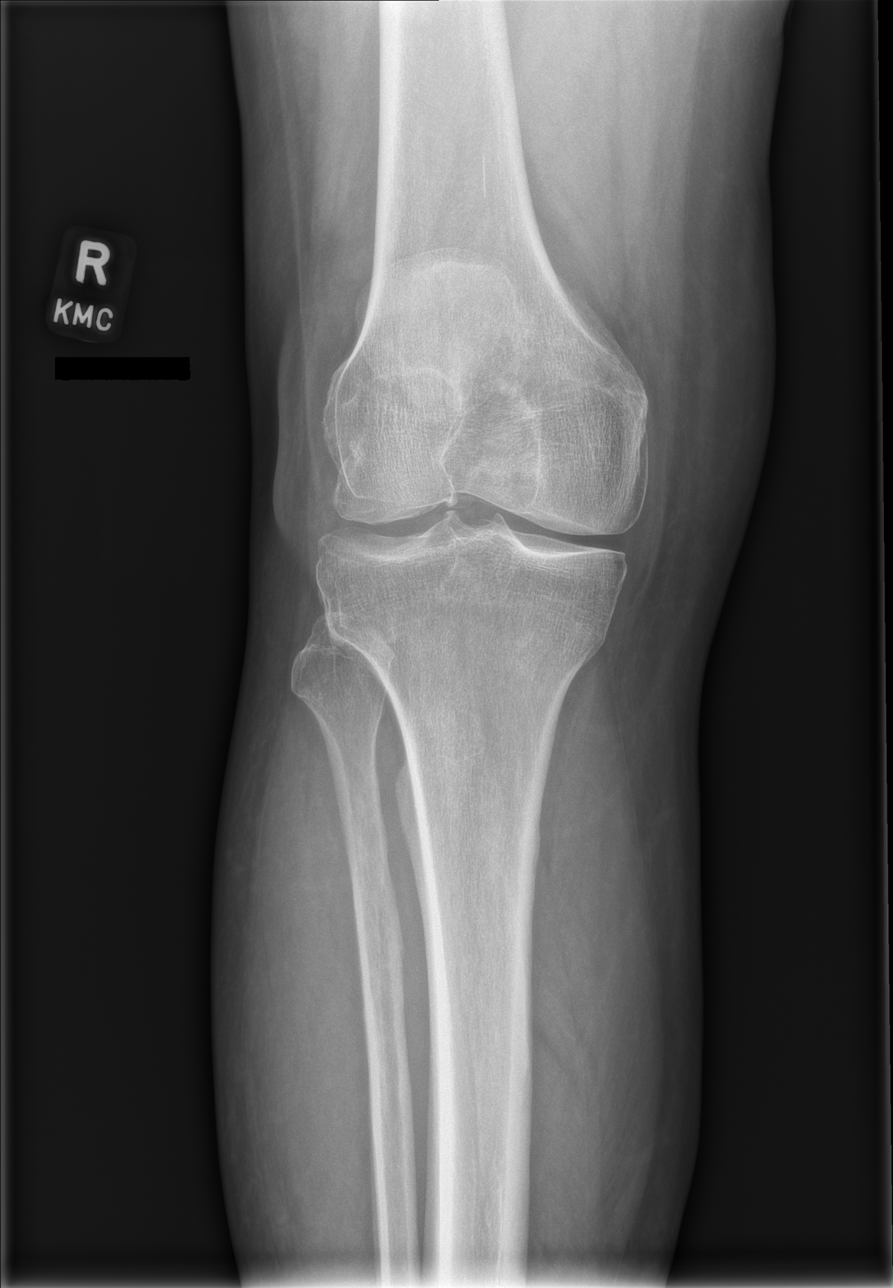

[w knee lat right]
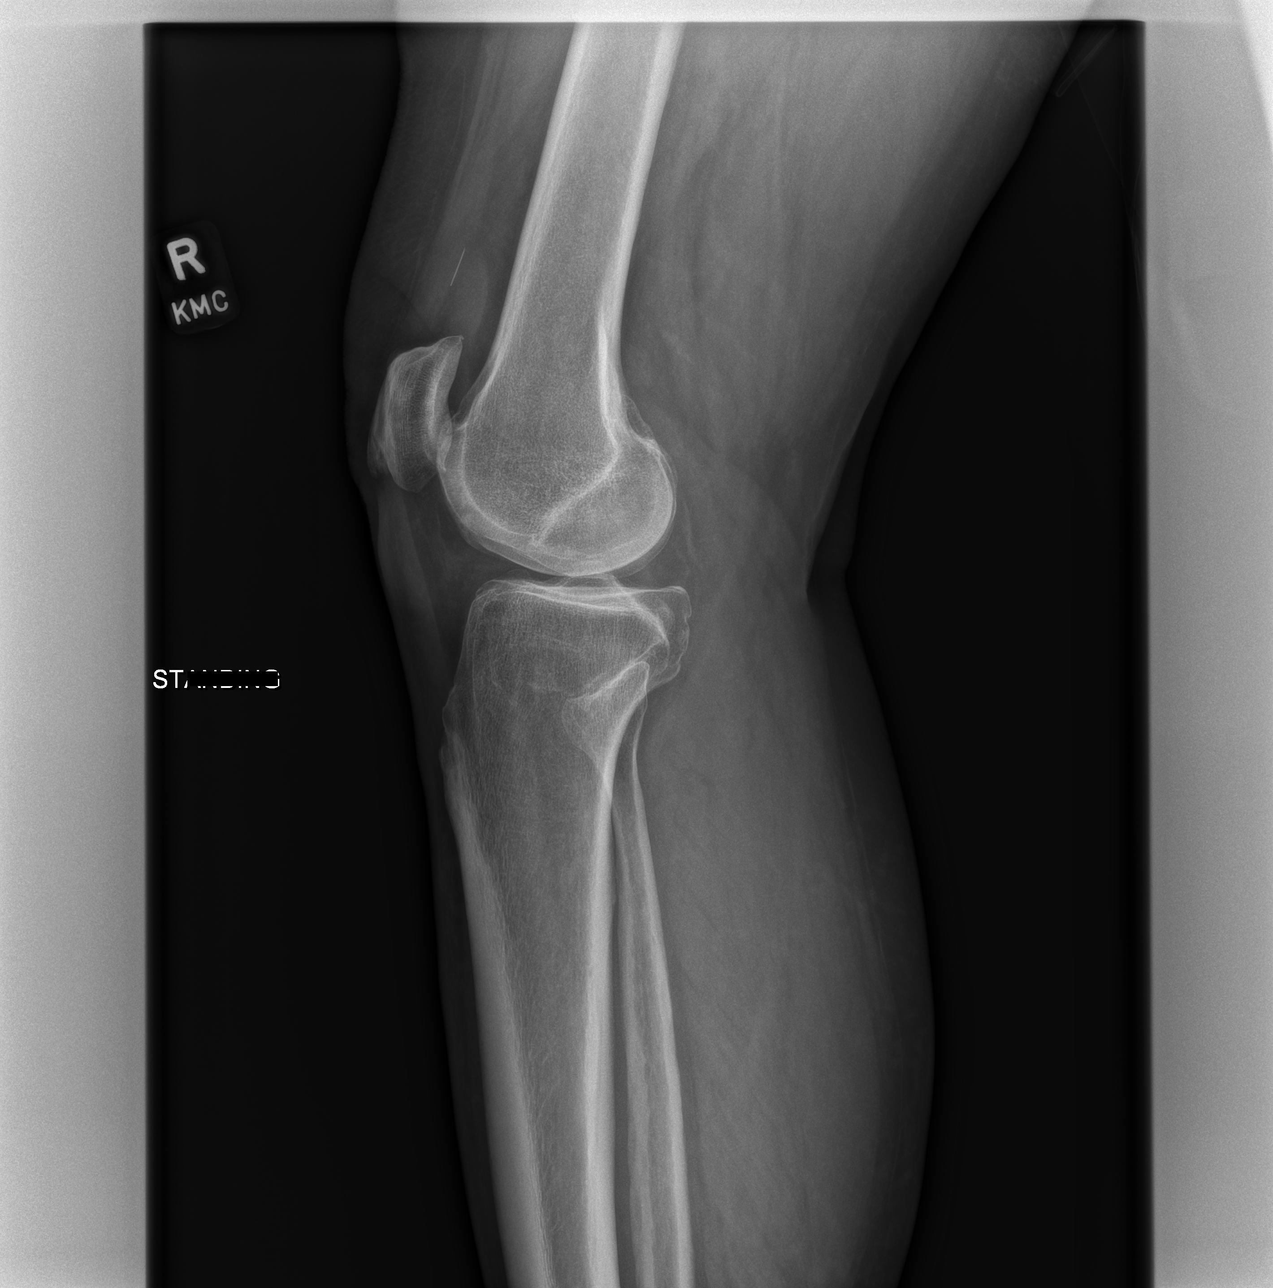

[w knee tunnel pa right]
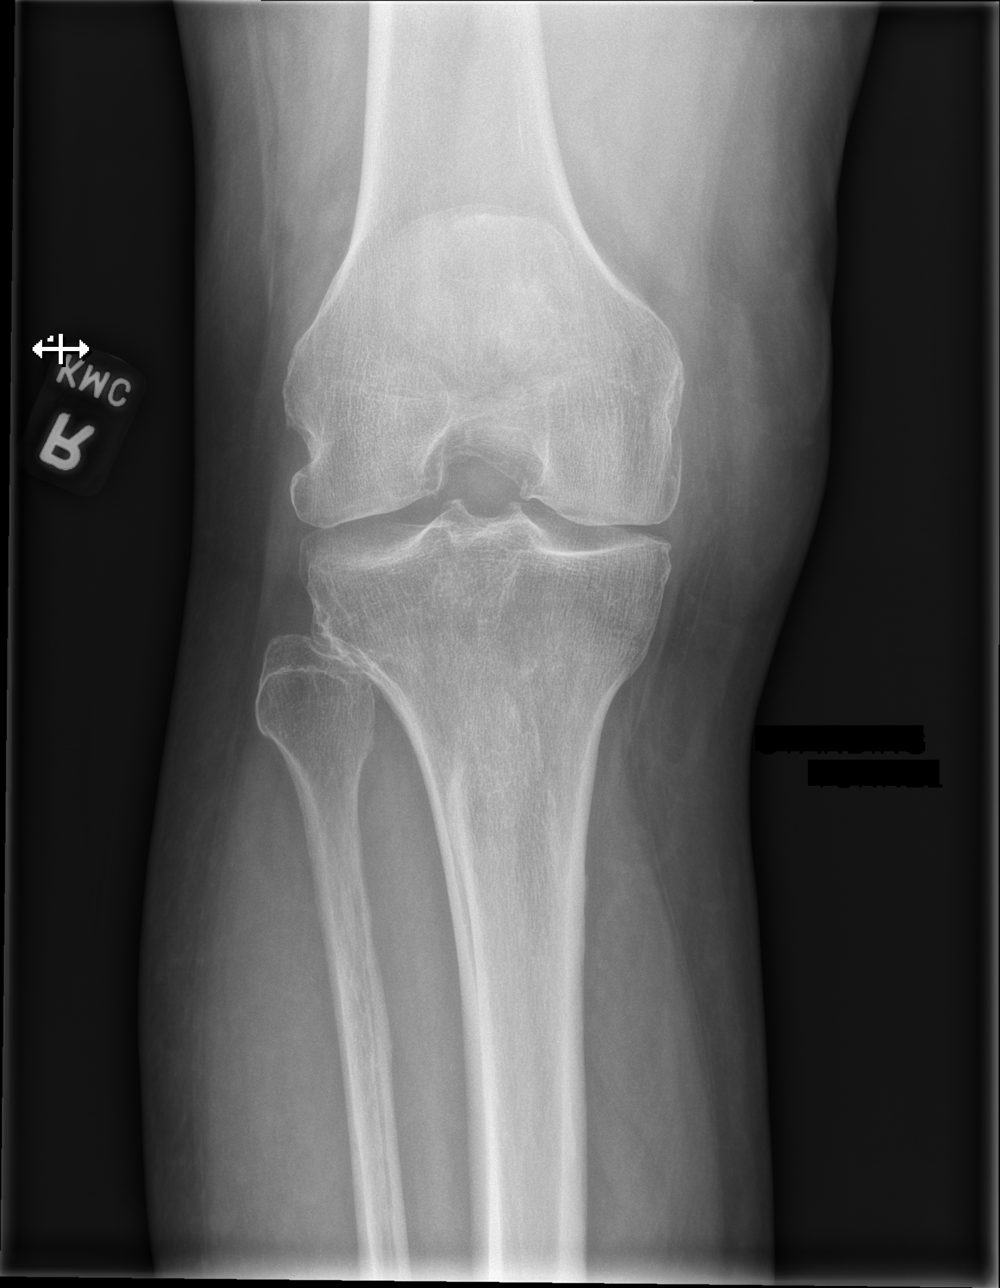

[4 of 4 positions shown; findings below may reference images not displayed]

FINDINGS: No fracture or malalignment. Mild tricompartment arthritis of the
knee. Small knee effusion. 12 mm chronic linear foreign body at the
suprapatellar region.
IMPRESSION: 1. No acute osseous abnormality
2. Tricompartment arthritis of the knee
3. 12 mm chronic linear foreign body at the suprapatellar region

## 2022-02-11 ENCOUNTER — Ambulatory Visit (INDEPENDENT_AMBULATORY_CARE_PROVIDER_SITE_OTHER): Payer: Medicare Other | Admitting: Family Medicine

## 2022-02-11 VITALS — BP 132/76 | HR 74 | Ht 74.0 in | Wt 255.2 lb

## 2022-02-11 DIAGNOSIS — M25511 Pain in right shoulder: Secondary | ICD-10-CM | POA: Diagnosis not present

## 2022-02-11 DIAGNOSIS — G8929 Other chronic pain: Secondary | ICD-10-CM | POA: Diagnosis not present

## 2022-02-11 NOTE — Progress Notes (Signed)
Subjective:    Patient ID: Jose Owen, male    DOB: [birth year], 81 y.o.   MRN: [id number]  Patient reports several years of right shoulder pain.  He is unable to abduct his arm greater than 90 degrees.  Glenohumeral arthritis was seen on an x-ray in 2010.  He denies any recent injuries.  He has pain with abduction greater than 90 degrees even passively.  He has weakness with abduction greater than 90 degrees.  He has pain with internal and external rotation.  I gave the patient a cortisone shot in June and the pain improved for several months but over the last 2 months the pain is returned.  He is preparing to perform a fair amount of work over the next week and the pain in his shoulder is getting worse.  He is requesting another cortisone injection in his shoulder.  He also describes a popping sensation in his shoulder.  He states that with certain movement, he feels like something pops in his shoulder from the front to the back.  This causes a sharp pain. Past Medical History:  Diagnosis Date   BPH (benign prostatic hyperplasia)    Past Surgical History:  Procedure Laterality Date   SKIN CANCER EXCISION  10/2020   right hand   Current Outpatient Medications on File Prior to Visit  Medication Sig Dispense Refill   acetaminophen (TYLENOL) 500 MG tablet Take 500 mg by mouth as needed.     MECLIZINE HCL PO Take by mouth as needed.     meloxicam (MOBIC) 7.5 MG tablet TAKE 1 TABLET BY MOUTH EVERY DAY AS NEEDED FOR PAIN 30 tablet 0   oxyCODONE-acetaminophen (PERCOCET) 7.5-325 MG tablet Take 1 tablet by mouth every 4 (four) hours as needed for severe pain. 30 tablet 0   sildenafil (VIAGRA) 100 MG tablet Take 1 tablet (100 mg total) by mouth daily as needed for erectile dysfunction. 10 tablet 5   tamsulosin (FLOMAX) 0.4 MG CAPS capsule TAKE 1 CAPSULE BY MOUTH EVERY DAY 90 capsule 3   vitamin B-12 (CYANOCOBALAMIN) 1000 MCG tablet Take 1 tablet (1,000 mcg total) by mouth daily.     loratadine  (CLARITIN) 10 MG tablet Take 10 mg by mouth as needed for allergies. (Patient not taking: Reported on 09/17/2021)     No current facility-administered medications on file prior to visit.   No Known Allergies Social History   Socioeconomic History   Marital status: Widowed    Spouse name: Not on file   Number of children: 2   Years of education: Not on file   Highest education level: Not on file  Occupational History   Not on file  Tobacco Use   Smoking status: Former    Packs/day: 0.50    Types: Cigarettes    Start date: 03/31/1962    Quit date: 04/01/1971    Years since quitting: 50.9   Smokeless tobacco: Never  Substance and Sexual Activity   Alcohol use: No   Drug use: No   Sexual activity: Not Currently  Other Topics Concern   Not on file  Social History Narrative   Widowed 03/2018.   Social Determinants of Health   Financial Resource Strain: Medium Risk (12/21/2020)   Overall Financial Resource Strain (CARDIA)    Difficulty of Paying Living Expenses: Somewhat hard  Food Insecurity: No Food Insecurity (12/21/2020)   Hunger Vital Sign    Worried About Running Out of Food in the Last Year: Never true  Ran Out of Food in the Last Year: Never true  Transportation Needs: No Transportation Needs (12/21/2020)   PRAPARE - Administrator, Civil Service (Medical): No    Lack of Transportation (Non-Medical): No  Physical Activity: Sufficiently Active (12/21/2020)   Exercise Vital Sign    Days of Exercise per Week: 3 days    Minutes of Exercise per Session: 60 min  Stress: No Stress Concern Present (12/21/2020)   [employer] of Occupational Health - Occupational Stress Questionnaire    Feeling of Stress : Only a little  Social Connections: Unknown (12/21/2020)   Social Connection and Isolation Panel [NHANES]    Frequency of Communication with Friends and Family: More than three times a week    Frequency of Social Gatherings with Friends and Family: Twice a week     Attends Religious Services: Not on Marketing executive or Organizations: No    Attends Banker Meetings: Not on file    Marital Status: Widowed  Intimate Partner Violence: Not At Risk (12/21/2020)   Humiliation, Afraid, Rape, and Kick questionnaire    Fear of Current or Ex-Partner: No    Emotionally Abused: No    Physically Abused: No    Sexually Abused: No   No family history on file. Father has a history of atrial fibrillation and prostate cancer.  Mother died due to old age.  He does have a family history of familial lipomatosis  Review of Systems  All other systems reviewed and are negative.      Objective:   Physical Exam Vitals reviewed.  Constitutional:      General: He is not in acute distress.    Appearance: Normal appearance. He is obese. He is not ill-appearing, toxic-appearing or diaphoretic.  HENT:     Head: Normocephalic and atraumatic.  Cardiovascular:     Rate and Rhythm: Normal rate and regular rhythm.     Pulses: Normal pulses.     Heart sounds: Normal heart sounds. No murmur heard.    No friction rub. No gallop.  Pulmonary:     Effort: Pulmonary effort is normal. No respiratory distress.     Breath sounds: Normal breath sounds. No stridor. No wheezing, rhonchi or rales.  Chest:     Chest wall: No tenderness.  Musculoskeletal:     Right shoulder: Bony tenderness present. Decreased range of motion. Decreased strength.     Cervical back: No tenderness.  Neurological:     General: No focal deficit present.     Mental Status: He is alert and oriented to person, place, and time. Mental status is at baseline.           Assessment & Plan:  Chronic right shoulder pain  I believe this is due to a combination of glenohumeral arthritis and likely tendinitis in the rotator cuff.  Using sterile technique, I injected the right subacromial space with 2 cc lidocaine, 2 cc of Marcaine, and 2 cc of 40 mg/mL Kenalog.  The patient  tolerated the procedure well without complication.  If pain persist despite cortisone injection, I would recommend orthopedic consultation

## 2022-03-12 ENCOUNTER — Ambulatory Visit (INDEPENDENT_AMBULATORY_CARE_PROVIDER_SITE_OTHER): Payer: Medicare Other

## 2022-03-12 VITALS — BP 122/76 | HR 67 | Ht 74.0 in | Wt 250.0 lb

## 2022-03-12 DIAGNOSIS — Z0000 Encounter for general adult medical examination without abnormal findings: Secondary | ICD-10-CM | POA: Diagnosis not present

## 2022-03-12 NOTE — Progress Notes (Signed)
Subjective:   Jose Owen is a 81 y.o. male who presents for Medicare Annual/Subsequent preventive examination.  Review of Systems     Cardiac Risk Factors include: advanced age (>59men, >52 women);male gender;obesity (BMI >30kg/m2);sedentary lifestyle     Objective:    Today's Vitals   03/12/22 0825  BP: 122/76  Pulse: 67  SpO2: 100%  Weight: 250 lb (113.4 kg)  Height: 6\' 2"  (1.88 m)   Body mass index is 32.1 kg/m.     03/12/2022    8:35 AM  Advanced Directives  Does Patient Have a Medical Advance Directive? Yes  Type of 03/14/2022 of [place];Living will  Copy of Healthcare Power of Attorney in Chart? No - copy requested    Current Medications (verified) Outpatient Encounter Medications as of 03/12/2022  Medication Sig   acetaminophen (TYLENOL) 500 MG tablet Take 500 mg by mouth as needed.   MECLIZINE HCL PO Take by mouth as needed.   meloxicam (MOBIC) 7.5 MG tablet TAKE 1 TABLET BY MOUTH EVERY DAY AS NEEDED FOR PAIN   oxyCODONE-acetaminophen (PERCOCET) 7.5-325 MG tablet Take 1 tablet by mouth every 4 (four) hours as needed for severe pain.   sildenafil (VIAGRA) 100 MG tablet Take 1 tablet (100 mg total) by mouth daily as needed for erectile dysfunction.   tamsulosin (FLOMAX) 0.4 MG CAPS capsule TAKE 1 CAPSULE BY MOUTH EVERY DAY   vitamin B-12 (CYANOCOBALAMIN) 1000 MCG tablet Take 1 tablet (1,000 mcg total) by mouth daily.   [DISCONTINUED] loratadine (CLARITIN) 10 MG tablet Take 10 mg by mouth as needed for allergies. (Patient not taking: Reported on 09/17/2021)   No facility-administered encounter medications on file as of 03/12/2022.    Allergies (verified) Patient has no known allergies.   History: Past Medical History:  Diagnosis Date   BPH (benign prostatic hyperplasia)    Past Surgical History:  Procedure Laterality Date   SKIN CANCER EXCISION  10/2020   right hand   History reviewed. No pertinent family history. Social  History   Socioeconomic History   Marital status: Widowed    Spouse name: Not on file   Number of children: 2   Years of education: Not on file   Highest education level: Not on file  Occupational History   Not on file  Tobacco Use   Smoking status: Former    Packs/day: 0.50    Types: Cigarettes    Start date: 03/31/1962    Quit date: 04/01/1971    Years since quitting: 50.9   Smokeless tobacco: Never  Substance and Sexual Activity   Alcohol use: No   Drug use: No   Sexual activity: Not Currently  Other Topics Concern   Not on file  Social History Narrative   Widowed 03/2018.   Social Determinants of Health   Financial Resource Strain: Low Risk  (03/12/2022)   Overall Financial Resource Strain (CARDIA)    Difficulty of Paying Living Expenses: Not hard at all  Food Insecurity: No Food Insecurity (03/12/2022)   Hunger Vital Sign    Worried About Running Out of Food in the Last Year: Never true    Ran Out of Food in the Last Year: Never true  Transportation Needs: No Transportation Needs (03/12/2022)   PRAPARE - 03/14/2022 (Medical): No    Lack of Transportation (Non-Medical): No  Physical Activity: Inactive (03/12/2022)   Exercise Vital Sign    Days of Exercise per Week: 0 days  Minutes of Exercise per Session: 0 min  Stress: No Stress Concern Present (03/12/2022)   [employer] - Occupational Stress Questionnaire    Feeling of Stress : Only a little  Social Connections: Unknown (03/12/2022)   Social Connection and Isolation Panel [NHANES]    Frequency of Communication with Friends and Family: Not on file    Frequency of Social Gatherings with Friends and Family: Not on file    Attends Religious Services: More than 4 times per year    Active Member of [employer] or Organizations: Not on file    Attends [employer] or Organization Meetings: Not on file    Marital Status: Not on file    Tobacco Counseling Counseling  given: Not Answered   Clinical Intake:  Pre-visit preparation completed: Yes  Pain : No/denies pain     BMI - recorded: 32.1 Nutritional Status: BMI > 30  Obese Nutritional Risks: Other (Comment) Diabetes: No  How often do you need to have someone help you when you read instructions, pamphlets, or other written materials from your doctor or pharmacy?: 1 - Never  Diabetic?NO  Interpreter Needed?: No  Information entered by :: Jose Jose Klutts, Jose Owen   Activities of Daily Living    03/12/2022    8:36 AM 03/12/2022    8:12 AM  In your present state of health, do you have any difficulty performing the following activities:  Hearing?  0  Vision? 0 0  Difficulty concentrating or making decisions? 1 1  Comment at times.   Walking or climbing stairs? 0 0  Dressing or bathing? 0 0  Doing errands, shopping? 0 0  Preparing Food and eating ? N N  Using the Toilet? N N  In the past six months, have you accidently leaked urine? N N  Do you have problems with loss of bowel control? N N  Managing your Medications? N N  Managing your Finances? N N  Housekeeping or managing your Housekeeping? N N    Patient Care Team: Jose BrooksPickard, Jose T, MD as PCP - General (Family Medicine)  Indicate any recent Medical Services you Jose Owen have received from other than [facility] providers in the past year (date Jose Owen be approximate).     Assessment:   This is a routine wellness examination for Jose HillDonald.  Hearing/Vision screen Hearing Screening - Comments:: Some hearing issues to R ear.  Vision Screening - Comments:: Glasses. Dr. Hyacinth MeekerMiller.   Dietary issues and exercise activities discussed: Current Exercise Habits: The patient does not participate in regular exercise at present, Exercise limited by: cardiac condition(s);orthopedic condition(s)   Goals Addressed             This Visit's Progress    Exercise 3x per week (30 min per time)         Depression Screen    03/12/2022    8:38 AM 03/12/2022     8:31 AM 02/11/2022    2:19 PM 12/21/2020    2:19 PM 07/22/2019    9:04 AM 03/05/2017   11:29 AM 12/05/2016   12:08 PM  PHQ 2/9 Scores  PHQ - 2 Score 0 0 0 0 0 0 0    Fall Risk    03/12/2022    8:36 AM 03/12/2022    8:12 AM 02/11/2022    2:19 PM 08/30/2020    2:06 PM 07/22/2019    9:04 AM  Fall Risk   Falls in the past year? 1 1 0 0 0  Number falls in  past yr: 0 0 0 0   Injury with Fall? 0 0 0 0   Risk for fall due to : History of fall(s)  No Fall Risks    Follow up Falls prevention discussed  Falls prevention discussed  Falls evaluation completed    FALL RISK PREVENTION PERTAINING TO THE HOME:  Any stairs in or around the home? Yes  If so, are there any without handrails? No  Home free of loose throw rugs in walkways, pet beds, electrical cords, etc? Yes  Adequate lighting in your home to reduce risk of falls? Yes   ASSISTIVE DEVICES UTILIZED TO PREVENT FALLS:  Life alert? No  Use of a cane, walker or w/c? No  Grab bars in the bathroom? Yes  Shower chair or bench in shower? Yes  Elevated toilet seat or a handicapped toilet? Yes   TIMED UP AND GO:  Was the test performed? Yes .  Length of time to ambulate 10 feet: 10 sec.   Gait steady and fast without use of assistive device  Cognitive Function:        03/12/2022    8:37 AM  6CIT Screen  What Year? 0 points  What month? 0 points  What time? 0 points  Count back from 20 0 points  Months in reverse 0 points  Repeat phrase 0 points  Total Score 0 points    Immunizations Immunization History  Administered Date(s) Administered   Fluad Quad(high Dose 65+) 12/25/2020, 01/10/2022   Influenza,inj,Quad PF,6+ Mos 03/03/2013   PFIZER(Purple Top)SARS-COV-2 Vaccination 04/22/2019, 05/23/2019   Pneumococcal Conjugate-13 03/03/2013   Pneumococcal Polysaccharide-23 07/22/2019    TDAP status: Up to date  Flu Vaccine status: Up to date  Pneumococcal vaccine status: Up to date  Covid-19 vaccine status: Completed  vaccines  Qualifies for Shingles Vaccine? Yes   Zostavax completed No   Shingrix Completed?: No.    Education has been provided regarding the importance of this vaccine. Patient has been advised to call insurance company to determine out of pocket expense if they have not yet received this vaccine. Advised [place] also receive vaccine at local pharmacy or Health Dept. Verbalized acceptance and understanding.  Screening Tests Health Maintenance  Topic Date Due   COVID-19 Vaccine (3 - Pfizer risk series) 03/28/2022 (Originally 06/20/2019)   Zoster Vaccines- Shingrix (1 of 2) 06/11/2022 (Originally 03/25/1960)   Hepatitis C Screening  03/13/2023 (Originally 03/25/1959)   Medicare Annual Wellness (AWV)  03/13/2023   Pneumonia Vaccine 50+ Years old  Completed   INFLUENZA VACCINE  Completed   HPV VACCINES  Aged Out   DTaP/Tdap/Td  Discontinued   COLONOSCOPY (Pts 45-36yrs Insurance coverage will need to be confirmed)  Discontinued    Health Maintenance  There are no preventive care reminders to display for this patient.   Colorectal cancer screening: No longer required.   Lung Cancer Screening: (Low Dose CT Chest recommended if Age 82-80 years, 30 pack-year currently smoking OR have quit w/in 15years.) does not qualify.   Lung Cancer Screening Referral: N/A  Additional Screening:  Hepatitis C Screening: does qualify; Completed DUE  Vision Screening: Recommended annual ophthalmology exams for early detection of glaucoma and other disorders of the eye. Is the patient up to date with their annual eye exam?  Yes  Who is the provider or what is the name of the office in which the patient attends annual eye exams? DR. Hyacinth Owen If pt is not established with a provider, would they like to be referred  to a provider to establish care? No .   Dental Screening: Recommended annual dental exams for proper oral hygiene  Community Resource Referral / Chronic Care Management: CRR required this visit?  No    CCM required this visit?  No      Plan:     I have personally reviewed and noted the following in the patient's chart:   Medical and social history Use of alcohol, tobacco or illicit drugs  Current medications and supplements including opioid prescriptions. Patient is currently taking opioid prescriptions. Information provided to patient regarding non-opioid alternatives. Patient advised to discuss non-opioid treatment plan with their provider. Functional ability and status Nutritional status Physical activity Advanced directives List of other physicians Hospitalizations, surgeries, and ER visits in previous 12 months Vitals Screenings to include cognitive, depression, and falls Referrals and appointments  In addition, I have reviewed and discussed with patient certain preventive protocols, quality metrics, and best practice recommendations. A written personalized care plan for preventive services as well as general preventive health recommendations were provided to patient.     Jose Dash, Jose Owen   [id number]   Nurse Notes: Discussed vaccines. Pt declined at this time.

## 2022-04-10 ENCOUNTER — Other Ambulatory Visit: Payer: Self-pay | Admitting: Family Medicine

## 2022-04-10 ENCOUNTER — Telehealth: Payer: Self-pay

## 2022-04-10 MED ORDER — HYDROCODONE BIT-HOMATROP MBR 5-1.5 MG/5ML PO SOLN: 5.0000 mL | Freq: Three times a day (TID) | ORAL | 0 refills | Status: DC | PRN

## 2022-04-10 MED ORDER — HYDROCOD POLI-CHLORPHE POLI ER 10-8 MG/5ML PO SUER: 5.0000 mL | Freq: Two times a day (BID) | ORAL | 0 refills | Status: DC | PRN

## 2022-04-10 NOTE — Telephone Encounter (Signed)
Pt called and states he went to pick up Hycodan and the pharmacy states they are out of the medication. Pt asks if a different Rx can be sent in? Thank you!

## 2022-04-10 NOTE — Telephone Encounter (Signed)
Pt called with c/o cough and congestion x 1 week. Pt states the congestion is better but still has a lingering cough. Pt has tried OTC and has Tessalon but it is not helping. Is there anything else he can try?

## 2022-04-11 ENCOUNTER — Telehealth: Payer: Self-pay

## 2022-04-11 ENCOUNTER — Other Ambulatory Visit: Payer: Self-pay | Admitting: Family Medicine

## 2022-04-11 MED ORDER — HYDROCOD POLI-CHLORPHE POLI ER 10-8 MG/5ML PO SUER: 5.0000 mL | Freq: Two times a day (BID) | ORAL | 0 refills | Status: DC | PRN

## 2022-04-11 NOTE — Telephone Encounter (Signed)
Pt called and stated that the meds called in was not available per pharmacy. Per pt he feels a little better this morning, so stated that 'just forget it and don't need meds anymore'   Pt did say that he has a little cough, and wanted to know what he can take OTC for that? Suggested to try Dylesum 24HR Cough.   Advice pt to also call should he starts to not feel good again. Pt voiced understanding.

## 2022-04-14 ENCOUNTER — Telehealth: Payer: Self-pay

## 2022-04-14 ENCOUNTER — Other Ambulatory Visit: Payer: Self-pay | Admitting: Family Medicine

## 2022-04-14 MED ORDER — PROMETHAZINE-DM 6.25-15 MG/5ML PO SYRP: 5.0000 mL | ORAL_SOLUTION | Freq: Four times a day (QID) | ORAL | 0 refills | Status: DC | PRN

## 2022-04-14 NOTE — Telephone Encounter (Signed)
Pt called and states pharmacy is out of Hycodan and Tessalon. Pt states the Pharmacy advised that they do have Promethazine DM. I did not speak with the patient, so I couldn't advise him that the medication is for nausea. Is there any other cough medication he could use? He has tried OTC meds, Mucinex and Delsym.

## 2022-05-13 ENCOUNTER — Telehealth: Payer: Self-pay

## 2022-05-13 NOTE — Telephone Encounter (Signed)
Pt called with c/o head cold. No fever or cough. Pt asks what you would recommend?  I advised patient that he can try Coricidin HBP. Pt asks if you have any other recommendations? Thanks.

## 2022-05-14 ENCOUNTER — Encounter: Payer: Self-pay | Admitting: Family Medicine

## 2022-05-14 ENCOUNTER — Ambulatory Visit (INDEPENDENT_AMBULATORY_CARE_PROVIDER_SITE_OTHER): Payer: Medicare Other | Admitting: Family Medicine

## 2022-05-14 VITALS — BP 138/72 | HR 86 | Temp 98.4°F | Ht 74.0 in | Wt 254.0 lb

## 2022-05-14 DIAGNOSIS — B9689 Other specified bacterial agents as the cause of diseases classified elsewhere: Secondary | ICD-10-CM

## 2022-05-14 DIAGNOSIS — J329 Chronic sinusitis, unspecified: Secondary | ICD-10-CM

## 2022-05-14 MED ORDER — AMOXICILLIN-POT CLAVULANATE 875-125 MG PO TABS: 1.0000 | ORAL_TABLET | Freq: Two times a day (BID) | ORAL | 0 refills | Status: DC

## 2022-05-14 NOTE — Progress Notes (Signed)
Acute Office Visit  Subjective:     Patient ID: Jose Owen, male    DOB: 05-26-1940, 82 y.o.   MRN: [id number]  Chief Complaint  Patient presents with   Acute Visit    terrible head cold; headache - JBG\    HPI Patient is in today for severe rhinorrhea with sinus pressure, and headache since Sunday Denies fever, sore throat, cough, shortness of breath, wheezing, chest pain Has had some sick family members Has tried Mucinex   Review of Systems  All other systems reviewed and are negative.   Past Medical History:  Diagnosis Date   BPH (benign prostatic hyperplasia)    Past Surgical History:  Procedure Laterality Date   SKIN CANCER EXCISION  10/2020   right hand   Current Outpatient Medications on File Prior to Visit  Medication Sig Dispense Refill   acetaminophen (TYLENOL) 500 MG tablet Take 500 mg by mouth as needed.     MECLIZINE HCL PO Take by mouth as needed.     meloxicam (MOBIC) 7.5 MG tablet TAKE 1 TABLET BY MOUTH EVERY DAY AS NEEDED FOR PAIN 30 tablet 0   oxyCODONE-acetaminophen (PERCOCET) 7.5-325 MG tablet Take 1 tablet by mouth every 4 (four) hours as needed for severe pain. 30 tablet 0   promethazine-dextromethorphan (PROMETHAZINE-DM) 6.25-15 MG/5ML syrup Take 5 mLs by mouth 4 (four) times daily as needed for cough. 118 mL 0   sildenafil (VIAGRA) 100 MG tablet Take 1 tablet (100 mg total) by mouth daily as needed for erectile dysfunction. 10 tablet 5   tamsulosin (FLOMAX) 0.4 MG CAPS capsule TAKE 1 CAPSULE BY MOUTH EVERY DAY 90 capsule 3   vitamin B-12 (CYANOCOBALAMIN) 1000 MCG tablet Take 1 tablet (1,000 mcg total) by mouth daily.     No current facility-administered medications on file prior to visit.   No Known Allergies     Objective:    BP 138/72   Pulse 86   Temp 98.4 F (36.9 C) (Oral)   Ht 6' 2"$  (1.88 m)   Wt 254 lb (115.2 kg)   SpO2 98%   BMI 32.61 kg/m    Physical Exam Vitals and nursing note reviewed.  Constitutional:       Appearance: Normal appearance. He is normal weight.  HENT:     Head: Normocephalic and atraumatic.     Right Ear: Tympanic membrane, ear canal and external ear normal.     Left Ear: Tympanic membrane, ear canal and external ear normal.     Nose: Congestion present.     Mouth/Throat:     Mouth: Mucous membranes are moist.     Pharynx: Oropharynx is clear.  Cardiovascular:     Rate and Rhythm: Normal rate and regular rhythm.     Pulses: Normal pulses.     Heart sounds: Normal heart sounds.  Pulmonary:     Effort: Pulmonary effort is normal.     Breath sounds: Normal breath sounds.  Skin:    General: Skin is warm and dry.     Capillary Refill: Capillary refill takes less than 2 seconds.  Neurological:     General: No focal deficit present.     Mental Status: He is alert and oriented to person, place, and time. Mental status is at baseline.  Psychiatric:        Mood and Affect: Mood normal.        Behavior: Behavior normal.        Thought Content: Thought content normal.  Judgment: Judgment normal.     No results found for any visits on 05/14/22.      Assessment & Plan:   Problem List Items Addressed This Visit       Respiratory   Bacterial sinusitis - Primary    Acute onset unilateral sinus pressure and pain with mucopurulent rhinorrhea for 4 days. Will treat for bacterial sinusitis with Augmentin 872-125 BID for 10 days. Instructed to return to office if symptoms persist or worsen. [place] continue symptomatic management with OTC Mucinex.      Relevant Medications   amoxicillin-clavulanate (AUGMENTIN) 875-125 MG tablet    Meds ordered this encounter  Medications   amoxicillin-clavulanate (AUGMENTIN) 875-125 MG tablet    Sig: Take 1 tablet by mouth 2 (two) times daily.    Dispense:  20 tablet    Refill:  0    Order Specific Question:   Supervising Provider    Answer:   Jenna Luo T [id number]    Return if symptoms worsen or fail to improve.  Rubie Maid,  FNP

## 2022-05-14 NOTE — Assessment & Plan Note (Signed)
Acute onset unilateral sinus pressure and pain with mucopurulent rhinorrhea for 4 days. Will treat for bacterial sinusitis with Augmentin 872-125 BID for 10 days. Instructed to return to office if symptoms persist or worsen. [place] continue symptomatic management with OTC Mucinex.

## 2022-06-02 NOTE — Patient Instructions (Incomplete)

## 2022-06-02 NOTE — Progress Notes (Unsigned)
PATIENT: Jose Owen DOB: July 08, 1940  REASON FOR VISIT: follow up HISTORY FROM: patient  No chief complaint on file.    HISTORY OF PRESENT ILLNESS:  06/02/22 ALL:  Jose Owen returns for follow up for OSA on CPAP.   05/29/2021 ALL: Jose Owen is a 82 y.o. male here today for follow up for OSA on CPAP. He continues to have dry mouth. He wakes multiple times at night  to drink water. He is interested in trying a different mask to see if this Rozario help. His compliance report below shows excellent compliance for days worn and for greater tan 4 hours. His apnea is well managed on a set pressure of 10 cmH20 with an AHI of 1.8.     11/28/2020 ALL:  Jose Owen is a 82 y.o. male here today for follow up for OSA on CPAP.  He has continued to work on improving compliance. He admits that he does not always take his machine with him if he is out of town or visiting his dad. He continues to have dry mouth. He is using a FFM. He is interested in trying a different mask to see if this Aube help. He wakes 2-3 times at night and needs to drink water. He reports that DME adjusted humidity but he has not tried to adjust again at home. Otherwise, he is doing well.     HISTORY: (copied from Dr Guadelupe Sabin previous note)  Jose Owen is a 82 year old right-handed gentleman with an underlying medical history of low back pain, and mild obesity, who presents for follow-up consultation of his obstructive sleep apnea after interim testing and starting CPAP therapy.  The patient is unaccompanied today.  I first met him at the request of his primary care physician on 08/04/2019, at which time he reported snoring and excessive daytime somnolence.  He also had a history of low testosterone.  He was advised to proceed with sleep testing.  He had a baseline sleep study followed by a CPAP titration study.  Baseline sleep study from 11/21/2019 showed a sleep efficiency of 82.5%, sleep latency 6 minutes, REM latency 67 minutes.  He had  an increased percentage of stage II sleep, and a reduced percentage of REM sleep at 9.5%.  He had 29 obstructive and 13 central apneas, 130 hypopneas for the night.  Total AHI was 30.5/h, REM AHI 39.4/h, supine AHI 32.7/h and O2 nadir 82%.  He was advised to proceed with a full night CPAP titration study, which he had on 12/19/2019. Sleep latency was 8 minutes, REM latency 43 minutes, sleep efficiency 86.4%, he had a normal percentage of stage I and stage II sleep, and increased percentage of slow-wave sleep and a near normal percentage of REM sleep at 17.3%.  He was fitted with a large full facemask after trying a nasal mask.  CPAP was titrated from 5 cm to 10 cm.  On the final pressure his AHI was 0.8/h, with nonsupine REM sleep achieved and O2 nadir of 91%.  Based on the test results I prescribed CPAP therapy for home use.  Set up date was 03/15/2020.    Today, 05/10/20: I reviewed his CPAP compliance data from 04/09/2020 through 05/12/2020, which is a total of 30 days, during which time he used his machine 27 days with percent use days greater than 4 hours at 57%, indicating mildly suboptimal compliance, average usage of 4 hours and 34 minutes, residual AHI at goal at 1.8/h, leak on the  low side with a 95th percentile at 3.7 L/min, pressure of 10 cm with EPR of 3.  In the month of early January through early February 20 04/05/2020 and 05/04/2020 his percent use days greater than 4 hours was slightly better at 60%, still suboptimal.  He reports overall doing fairly well but he does have significant issues with mouth dryness.  He called his DME provider at least twice, the temperature was first turned down to 74 degrees and then to room air but he still wakes up with severely dry mouth.  He has to sip water in the middle of the night and sometimes he just gets too frustrated and does not put it back on.  He did go to [place] for his grandsons wedding and missed 2 days.  He is willing to continue with treatment and  actually has already noticed some benefit and that his nocturia is better and daytime energy are better.  He is motivated to continue with treatment and working on compliance.  He is using a large Simplus full facemask without any problems.  Chinstrap was given to him but it does not fit and he does not actually need it.    The patient's allergies, current medications, family history, past medical history, past social history, past surgical history and problem list were reviewed and updated as appropriate.   REVIEW OF SYSTEMS: Out of a complete 14 system review of symptoms, the patient complains only of the following symptoms, dry mouth and all other reviewed systems are negative.  ESS: 12  ALLERGIES: No Known Allergies  HOME MEDICATIONS: Outpatient Medications Prior to Visit  Medication Sig Dispense Refill   acetaminophen (TYLENOL) 500 MG tablet Take 500 mg by mouth as needed.     amoxicillin-clavulanate (AUGMENTIN) 875-125 MG tablet Take 1 tablet by mouth 2 (two) times daily. 20 tablet 0   MECLIZINE HCL PO Take by mouth as needed.     meloxicam (MOBIC) 7.5 MG tablet TAKE 1 TABLET BY MOUTH EVERY DAY AS NEEDED FOR PAIN 30 tablet 0   oxyCODONE-acetaminophen (PERCOCET) 7.5-325 MG tablet Take 1 tablet by mouth every 4 (four) hours as needed for severe pain. 30 tablet 0   promethazine-dextromethorphan (PROMETHAZINE-DM) 6.25-15 MG/5ML syrup Take 5 mLs by mouth 4 (four) times daily as needed for cough. 118 mL 0   sildenafil (VIAGRA) 100 MG tablet Take 1 tablet (100 mg total) by mouth daily as needed for erectile dysfunction. 10 tablet 5   tamsulosin (FLOMAX) 0.4 MG CAPS capsule TAKE 1 CAPSULE BY MOUTH EVERY DAY 90 capsule 3   vitamin B-12 (CYANOCOBALAMIN) 1000 MCG tablet Take 1 tablet (1,000 mcg total) by mouth daily.     No facility-administered medications prior to visit.    PAST MEDICAL HISTORY: Past Medical History:  Diagnosis Date   BPH (benign prostatic hyperplasia)     PAST  SURGICAL HISTORY: Past Surgical History:  Procedure Laterality Date   SKIN CANCER EXCISION  10/2020   right hand    FAMILY HISTORY: No family history on file.  SOCIAL HISTORY: Social History   Socioeconomic History   Marital status: Widowed    Spouse name: Not on file   Number of children: 2   Years of education: Not on file   Highest education level: Not on file  Occupational History   Not on file  Tobacco Use   Smoking status: Former    Packs/day: 0.50    Types: Cigarettes    Start date: 03/31/1962  Quit date: 04/01/1971    Years since quitting: 51.2   Smokeless tobacco: Never  Substance and Sexual Activity   Alcohol use: No   Drug use: No   Sexual activity: Not Currently  Other Topics Concern   Not on file  Social History Narrative   Widowed 03/2018.   Social Determinants of Health   Financial Resource Strain: Low Risk  (03/12/2022)   Overall Financial Resource Strain (CARDIA)    Difficulty of Paying Living Expenses: Not hard at all  Food Insecurity: No Food Insecurity (03/12/2022)   Hunger Vital Sign    Worried About Running Out of Food in the Last Year: Never true    Ran Out of Food in the Last Year: Never true  Transportation Needs: No Transportation Needs (03/12/2022)   PRAPARE - Hydrologist (Medical): No    Lack of Transportation (Non-Medical): No  Physical Activity: Inactive (03/12/2022)   Exercise Vital Sign    Days of Exercise per Week: 0 days    Minutes of Exercise per Session: 0 min  Stress: No Stress Concern Present (03/12/2022)   Advance    Feeling of Stress : Only a little  Social Connections: Unknown (03/12/2022)   Social Connection and Isolation Panel [NHANES]    Frequency of Communication with Friends and Family: Not on file    Frequency of Social Gatherings with Friends and Family: Not on file    Attends Religious Services: More than 4 times  per year    Active Member of [employer] or Organizations: Not on file    Attends Archivist Meetings: Not on file    Marital Status: Not on file  Intimate Partner Violence: Not At Risk (03/12/2022)   Humiliation, Afraid, Rape, and Kick questionnaire    Fear of Current or Ex-Partner: No    Emotionally Abused: No    Physically Abused: No    Sexually Abused: No     PHYSICAL EXAM  There were no vitals filed for this visit.   There is no height or weight on file to calculate BMI.  Generalized: Well developed, in no acute distress  Cardiology: normal rate and rhythm, no murmur noted Respiratory: clear to auscultation bilaterally  Neurological examination  Mentation: Alert oriented to time, place, history taking. Follows all commands speech and language fluent Cranial nerve II-XII: Pupils were equal round reactive to light. Extraocular movements were full, visual field were full  Motor: The motor testing reveals 5 over 5 strength of all 4 extremities. Good symmetric motor tone is noted throughout.  Gait and station: Gait is normal.    DIAGNOSTIC DATA (LABS, IMAGING, TESTING) - I reviewed patient records, labs, notes, testing and imaging myself where available.      No data to display           Lab Results  Component Value Date   WBC 4.3 12/25/2020   HGB 14.7 12/25/2020   HCT 45.3 12/25/2020   MCV 94.8 12/25/2020   PLT 199 12/25/2020      Component Value Date/Time   NA 140 12/25/2020 0934   K 4.0 12/25/2020 0934   CL 106 12/25/2020 0934   CO2 24 12/25/2020 0934   GLUCOSE 105 (H) 12/25/2020 0934   BUN 17 12/25/2020 0934   CREATININE 1.00 12/25/2020 0934   CALCIUM 9.2 12/25/2020 0934   PROT 6.5 12/25/2020 0934   AST 19 12/25/2020 0934   ALT 17 12/25/2020  0934   BILITOT 0.8 12/25/2020 0934   GFRNONAA 81 07/22/2019 0938   GFRAA 93 07/22/2019 0938   Lab Results  Component Value Date   CHOL 164 07/22/2019   HDL 39 (L) 07/22/2019   LDLCALC 104 (H) 07/22/2019    TRIG 118 07/22/2019   CHOLHDL 4.2 07/22/2019   No results found for: "HGBA1C" Lab Results  Component Value Date   [id number] 12/25/2020   Lab Results  Component Value Date   TSH 2.57 12/25/2020     ASSESSMENT AND PLAN 82 y.o. year old male  has a past medical history of BPH (benign prostatic hyperplasia). here with   No diagnosis found.   Cadin Oetting Pagaduan is doing fairly well on CPAP therapy. Compliance report reveals excellent daily and acceptable four hour compliance. He continues to have difficulty with dry mouth. I will order a mask refitting per his request. I have advised he try Biotene procuts OTC as well as adjust humidity at home. He was encouraged to continue using CPAP nightly and for greater than 4 hours each night. Risks of untreated sleep apnea review and education materials provided. Healthy lifestyle habits encouraged. He will follow up in 1 year, sooner if needed. He verbalizes understanding and agreement with this plan.    No orders of the defined types were placed in this encounter.     No orders of the defined types were placed in this encounter.     Debbora Presto, FNP-C 06/02/2022, 9:42 AM [facility] [address] [place], [place] [address] [phone]

## 2022-06-04 ENCOUNTER — Encounter: Payer: Self-pay | Admitting: Family Medicine

## 2022-06-04 ENCOUNTER — Ambulatory Visit: Payer: Medicare Other | Admitting: Family Medicine

## 2022-06-04 VITALS — BP 144/75 | HR 70 | Ht 74.0 in | Wt 256.0 lb

## 2022-06-04 DIAGNOSIS — G4733 Obstructive sleep apnea (adult) (pediatric): Secondary | ICD-10-CM | POA: Diagnosis not present

## 2022-06-12 DIAGNOSIS — H524 Presbyopia: Secondary | ICD-10-CM | POA: Diagnosis not present

## 2022-07-15 ENCOUNTER — Telehealth: Payer: Self-pay | Admitting: Family Medicine

## 2022-07-15 NOTE — Telephone Encounter (Signed)
Phone room: please call pt and let him know his cpap supplies order was sent to [employer] on 06/04/22. They replied back confirming they got the order. He will need to follow up with them. He can call them at [phone].

## 2022-07-15 NOTE — Telephone Encounter (Signed)
Patient left a voicemail on my phone stating the DME company reached out to him about getting new CPAP supply but he hasn't received any CPAP supply yet and is wondering when he will get that and if there is anything he needs to do.

## 2022-07-17 DIAGNOSIS — G4733 Obstructive sleep apnea (adult) (pediatric): Secondary | ICD-10-CM | POA: Diagnosis not present

## 2022-11-24 ENCOUNTER — Emergency Department (HOSPITAL_BASED_OUTPATIENT_CLINIC_OR_DEPARTMENT_OTHER)
Admission: EM | Admit: 2022-11-24 | Discharge: 2022-11-24 | Disposition: A | Discharge status: Home / Self Care | Payer: Medicare Other | Attending: Emergency Medicine | Admitting: Emergency Medicine

## 2022-11-24 ENCOUNTER — Emergency Department (HOSPITAL_BASED_OUTPATIENT_CLINIC_OR_DEPARTMENT_OTHER): Payer: Medicare Other | Admitting: Radiology

## 2022-11-24 ENCOUNTER — Other Ambulatory Visit: Payer: Self-pay

## 2022-11-24 ENCOUNTER — Encounter (HOSPITAL_BASED_OUTPATIENT_CLINIC_OR_DEPARTMENT_OTHER): Payer: Self-pay | Admitting: Emergency Medicine

## 2022-11-24 DIAGNOSIS — W312XXA Contact with powered woodworking and forming machines, initial encounter: Secondary | ICD-10-CM | POA: Diagnosis not present

## 2022-11-24 DIAGNOSIS — S61012A Laceration without foreign body of left thumb without damage to nail, initial encounter: Secondary | ICD-10-CM | POA: Diagnosis not present

## 2022-11-24 DIAGNOSIS — S62522B Displaced fracture of distal phalanx of left thumb, initial encounter for open fracture: Secondary | ICD-10-CM | POA: Diagnosis not present

## 2022-11-24 DIAGNOSIS — Z23 Encounter for immunization: Secondary | ICD-10-CM | POA: Insufficient documentation

## 2022-11-24 DIAGNOSIS — S61002A Unspecified open wound of left thumb without damage to nail, initial encounter: Secondary | ICD-10-CM

## 2022-11-24 DIAGNOSIS — M1812 Unilateral primary osteoarthritis of first carpometacarpal joint, left hand: Secondary | ICD-10-CM | POA: Diagnosis not present

## 2022-11-24 MED ORDER — TRAMADOL HCL 50 MG PO TABS
50.0000 mg | ORAL_TABLET | Freq: Four times a day (QID) | ORAL | 0 refills | Status: DC | PRN
Start: 2022-11-24 — End: 2022-12-03

## 2022-11-24 MED ORDER — TETANUS-DIPHTH-ACELL PERTUSSIS 5-2.5-18.5 LF-MCG/0.5 IM SUSY
0.5000 mL | PREFILLED_SYRINGE | Freq: Once | INTRAMUSCULAR | Status: AC
  Administered 2022-11-24: 0.5 mL via INTRAMUSCULAR
  Filled 2022-11-24: qty 0.5

## 2022-11-24 MED ORDER — LIDOCAINE HCL (PF) 1 % IJ SOLN
20.0000 mL | Freq: Once | INTRAMUSCULAR | Status: AC
  Administered 2022-11-24: 20 mL via INTRADERMAL

## 2022-11-24 MED ORDER — CEPHALEXIN 500 MG PO CAPS: 500.0000 mg | ORAL_CAPSULE | Freq: Four times a day (QID) | ORAL | 0 refills | Status: DC

## 2022-11-24 MED ORDER — CEFAZOLIN SODIUM-DEXTROSE 1-4 GM/50ML-% IV SOLN
1.0000 g | Freq: Once | INTRAVENOUS | Status: AC
  Administered 2022-11-24: 1 g via INTRAVENOUS
  Filled 2022-11-24: qty 50

## 2022-11-24 MED ORDER — LIDOCAINE HCL (PF) 1 % IJ SOLN
30.0000 mL | Freq: Once | INTRAMUSCULAR | Status: DC
  Filled 2022-11-24: qty 30

## 2022-11-24 NOTE — ED Triage Notes (Signed)
Pt arrives to ED with c/o left thumb injury after cutting it on a table saw today.

## 2022-11-24 NOTE — ED Provider Notes (Signed)
[place] EMERGENCY DEPARTMENT AT [facility] Provider Note   CSN: [id number] Arrival date & time: 11/24/22  1335     History  Chief Complaint  Patient presents with   Finger Injury    Jose Owen is a 82 y.o. male.  Patient with history of BPH present for evaluation of left thumb laceration after injury by table saw. He complains of a laceration across the distal portion of the thumb. Injury occurred just prior to arrival. Unknown tetanus status. He is right hand dominant.   The history is provided by the patient. No language interpreter was used.       Home Medications Prior to Admission medications   Medication Sig Start Date End Date Taking? Authorizing Provider  cephALEXin (KEFLEX) 500 MG capsule Take 1 capsule (500 mg total) by mouth 4 (four) times daily. 11/24/22  Yes Mayia Megill, Melvenia Beam, PA-C  traMADol (ULTRAM) 50 MG tablet Take 1 tablet (50 mg total) by mouth every 6 (six) hours as needed. 11/24/22  Yes Laurence Spates, MD  acetaminophen (TYLENOL) 500 MG tablet Take 500 mg by mouth as needed.    [provider]  amoxicillin-clavulanate (AUGMENTIN) 875-125 MG tablet Take 1 tablet by mouth 2 (two) times daily. 05/14/22   Park Meo, FNP  MECLIZINE HCL PO Take by mouth as needed.    [provider]  meloxicam (MOBIC) 7.5 MG tablet TAKE 1 TABLET BY MOUTH EVERY DAY AS NEEDED FOR PAIN 08/27/21   Donita Brooks, MD  promethazine-dextromethorphan (PROMETHAZINE-DM) 6.25-15 MG/5ML syrup Take 5 mLs by mouth 4 (four) times daily as needed for cough. 04/14/22   Donita Brooks, MD  sildenafil (VIAGRA) 100 MG tablet Take 1 tablet (100 mg total) by mouth daily as needed for erectile dysfunction. 10/22/21   Donita Brooks, MD  tamsulosin (FLOMAX) 0.4 MG CAPS capsule TAKE 1 CAPSULE BY MOUTH EVERY DAY 10/22/21   Donita Brooks, MD  vitamin B-12 (CYANOCOBALAMIN) 1000 MCG tablet Take 1 tablet (1,000 mcg total) by mouth daily. 12/08/16   Salley Scarlet, MD       Allergies    Patient has no known allergies.    Review of Systems   Review of Systems  Physical Exam Updated Vital Signs BP (!) 167/99 (BP Location: Right Arm)   Pulse 80   Temp 97.6 F (36.4 C) (Temporal)   Resp 15   SpO2 97%  Physical Exam Constitutional:      Appearance: Normal appearance.  Musculoskeletal:        General: Normal range of motion.  Skin:    General: Skin is warm and dry.     Comments: 3 cm jagged, full thickness laceration involving distal pad of thumb. Nail intact.   Neurological:     Mental Status: He is alert.     Sensory: Sensory deficit (Decreased sensation to distal thumb. Intact sensation at proximal portion of distal phalanx of left thumb.) present.            ED Results / Procedures / Treatments   Labs (all labs ordered are listed, but only abnormal results are displayed) Labs Reviewed - No data to display  EKG None  Radiology DG Finger Thumb Left  Result Date: 11/24/2022 CLINICAL DATA:  Laceration of the thumb EXAM: LEFT THUMB 2+V COMPARISON:  None Available. FINDINGS: Comminuted fracture of the tip of the tuft of the first distal phalanx. Overlying soft tissue laceration. No other fracture or dislocation. Mild osteoarthritis of the first  MCP joint and first IP joint. Severe osteoarthritis of the first [facility] joint. No aggressive osseous lesion. Normal alignment. IMPRESSION: 1. Comminuted fracture of the tip of the tuft of the first distal phalanx. Overlying soft tissue laceration. Electronically Signed   By: Elige Ko M.D.   On: 11/24/2022 15:04    Procedures .Marland KitchenLaceration Repair  Date/Time: 11/24/2022 4:33 PM  Performed by: Elpidio Anis, PA-C Authorized by: Elpidio Anis, PA-C   Consent:    Consent obtained:  Verbal   Consent given by:  Patient   Risks discussed:  Pain, retained foreign body and infection Universal protocol:    Procedure explained and questions answered to patient or proxy's satisfaction: yes      Patient identity confirmed:  Verbally with patient Anesthesia:    Anesthesia method:  Nerve block   Block anesthetic:  Lidocaine 1% w/o epi   Block technique:  Ring block   Block outcome:  Anesthesia achieved Laceration details:    Location:  Finger   Finger location:  L thumb   Length (cm):  3 Pre-procedure details:    Preparation:  Patient was prepped and draped in usual sterile fashion and imaging obtained to evaluate for foreign bodies Exploration:    Hemostasis achieved with:  Direct pressure Treatment:    Area cleansed with:  Povidone-iodine and saline   Amount of cleaning:  Extensive   Irrigation solution:  Sterile saline   Irrigation volume:  1 liter   Irrigation method:  Syringe   Debridement:  Minimal Skin repair:    Repair method:  Sutures   Suture size:  4-0   Suture material:  Prolene   Suture technique:  Simple interrupted Approximation:    Approximation:  Loose Repair type:    Repair type:  Intermediate Comments:     Open fracture of thumb laceration. Repair per instruction of hand ortho, Dr. Kerry Fort.     Medications Ordered in ED Medications  Tdap (BOOSTRIX) injection 0.5 mL (0.5 mLs Intramuscular Given 11/24/22 1436)  ceFAZolin (ANCEF) IVPB 1 g/50 mL premix (0 g Intravenous Stopped 11/24/22 1631)  lidocaine (PF) (XYLOCAINE) 1 % injection 20 mL (20 mLs Intradermal Given 11/24/22 1547)    ED Course/ Medical Decision Making/ A&P Clinical Course as of 11/24/22 1642  Mon Nov 24, 2022  1412 Laceration to distal thumb by table saw. No nail injury. Suspect open tuft fracture. Imaging pending. Tetanus ordered. Patient declines pain management for now.  [SU]  1445 Xray reviewed and interpreted by me showing fragmented tuft fracture. Consult to hand ortho initiated.  [SU]  1524 Discussed care with Dr. Kerry Fort. Photos on chart reviewed by him. IV Ancef, extensive irrigation, loose closure recommended. He will follow up in the office in one week.  [SU]  1631  Procedure completed. Topical abx and bandage. Will provide care instruction, follow up information. All questions answered.  [SU]    Clinical Course User Index [SU] Elpidio Anis, PA-C                                 Medical Decision Making Amount and/or Complexity of Data Reviewed Radiology: ordered.  Risk Prescription drug management.           Final Clinical Impression(s) / ED Diagnoses Final diagnoses:  Open displaced fracture of distal phalanx of left thumb, initial encounter  Open wound of left thumb, initial encounter    Rx / DC Orders ED Discharge Orders  Ordered    cephALEXin (KEFLEX) 500 MG capsule  4 times daily        11/24/22 1639    traMADol (ULTRAM) 50 MG tablet  Every 6 hours PRN        11/24/22 1641              Elpidio Anis, PA-C 11/24/22 1643    Alvira Monday, MD 11/25/22 [phone]

## 2022-12-02 DIAGNOSIS — S61012A Laceration without foreign body of left thumb without damage to nail, initial encounter: Secondary | ICD-10-CM | POA: Insufficient documentation

## 2022-12-03 ENCOUNTER — Ambulatory Visit (INDEPENDENT_AMBULATORY_CARE_PROVIDER_SITE_OTHER): Payer: Medicare Other | Admitting: Family Medicine

## 2022-12-03 ENCOUNTER — Encounter: Payer: Self-pay | Admitting: Family Medicine

## 2022-12-03 VITALS — BP 128/82 | HR 67 | Temp 97.6°F | Ht 74.0 in | Wt 253.0 lb

## 2022-12-03 DIAGNOSIS — R42 Dizziness and giddiness: Secondary | ICD-10-CM | POA: Insufficient documentation

## 2022-12-03 NOTE — Progress Notes (Signed)
Subjective:  HPI: Jose Owen is a 82 y.o. male presenting on 12/03/2022 for Follow-up (F/u med poss cause of vertigo/dizzieness )   HPI Patient is in today for dizziness after starting Cephalexin and Tramadol for a finger cut. He describes it as worse in AM, where he could hardly walk, and the room was spinning when he would first get up. This was discontinued Monday by the treating provider and his symptoms have resolved. He has not experienced the dizziness today. He denies syncope or presyncope, endorses a little nausea and dull headache associated with the dizziness. No fever, vomiting, weakness, vision changes.  Review of Systems  All other systems reviewed and are negative.   Relevant past medical history reviewed and updated as indicated.   Past Medical History:  Diagnosis Date   BPH (benign prostatic hyperplasia)      Past Surgical History:  Procedure Laterality Date   SKIN CANCER EXCISION  10/2020   right hand    Allergies and medications reviewed and updated.   Current Outpatient Medications:    acetaminophen (TYLENOL) 500 MG tablet, Take 500 mg by mouth as needed., Disp: , Rfl:    MECLIZINE HCL PO, Take by mouth as needed., Disp: , Rfl:    meloxicam (MOBIC) 7.5 MG tablet, TAKE 1 TABLET BY MOUTH EVERY DAY AS NEEDED FOR PAIN, Disp: 30 tablet, Rfl: 0   promethazine-dextromethorphan (PROMETHAZINE-DM) 6.25-15 MG/5ML syrup, Take 5 mLs by mouth 4 (four) times daily as needed for cough., Disp: 118 mL, Rfl: 0   tamsulosin (FLOMAX) 0.4 MG CAPS capsule, TAKE 1 CAPSULE BY MOUTH EVERY DAY, Disp: 90 capsule, Rfl: 3   vitamin B-12 (CYANOCOBALAMIN) 1000 MCG tablet, Take 1 tablet (1,000 mcg total) by mouth daily., Disp: , Rfl:   No Known Allergies  Objective:   BP 128/82   Pulse 67   Temp 97.6 F (36.4 C) (Oral)   Ht 6\' 2"  (1.88 m)   Wt 253 lb (114.8 kg)   SpO2 99%   BMI 32.48 kg/m      12/03/2022    9:18 AM 11/24/2022    1:45 PM 06/04/2022    7:37 AM  Vitals with BMI   Height 6\' 2"   6\' 2"   Weight 253 lbs  256 lbs  BMI 32.47  32.85  Systolic 128 167 161  Diastolic 82 99 75  Pulse 67 80 70     Physical Exam Vitals and nursing note reviewed.  Constitutional:      Appearance: Normal appearance. He is normal weight.  HENT:     Head: Normocephalic and atraumatic.  Cardiovascular:     Rate and Rhythm: Normal rate and regular rhythm.     Pulses: Normal pulses.     Heart sounds: Normal heart sounds.  Pulmonary:     Effort: Pulmonary effort is normal.     Breath sounds: Normal breath sounds.  Skin:    General: Skin is warm and dry.     Capillary Refill: Capillary refill takes less than 2 seconds.  Neurological:     General: No focal deficit present.     Mental Status: He is alert and oriented to person, place, and time. Mental status is at baseline.  Psychiatric:        Mood and Affect: Mood normal.        Behavior: Behavior normal.        Thought Content: Thought content normal.        Judgment: Judgment normal.  Assessment & Plan:  Dizziness Assessment & Plan: Symptoms overall improving with discontinuation of Cephalexin and Tramadol. Negative HINTS and Gilberto Better. Negative orthostatics. Instructed to return to office if symptoms return and persist.      Follow up plan: Return if symptoms worsen or fail to improve.  Park Meo, FNP

## 2022-12-03 NOTE — Assessment & Plan Note (Signed)
Symptoms overall improving with discontinuation of Cephalexin and Tramadol. Negative HINTS and Gilberto Better. Negative orthostatics. Instructed to return to office if symptoms return and persist.

## 2022-12-05 ENCOUNTER — Telehealth: Payer: Self-pay

## 2022-12-05 ENCOUNTER — Telehealth: Payer: Self-pay | Admitting: Family Medicine

## 2022-12-05 ENCOUNTER — Other Ambulatory Visit: Payer: Self-pay | Admitting: Family Medicine

## 2022-12-05 MED ORDER — MECLIZINE HCL 25 MG PO TABS: 25.0000 mg | ORAL_TABLET | Freq: Three times a day (TID) | ORAL | 0 refills | Status: DC | PRN

## 2022-12-05 NOTE — Telephone Encounter (Signed)
Transition Care Management Follow-up Telephone Call Date of discharge and from where: 11/24/2022 [facility] How have you been since you were released from the hospital? Patient stated he is feeling better and his thumb is healing well. He is experiencing vertigo from Keflex this med has was discontinued 12/03/2022. Any questions or concerns? No  Items Reviewed: Did the pt receive and understand the discharge instructions provided? Yes  Medications obtained and verified? Yes  Other? No  Any new allergies since your discharge? He is experiencing vertigo from Keflex this med has was discontinued 12/03/2022. Dietary orders reviewed? Yes Do you have support at home? Yes   Follow up appointments reviewed:  PCP Hospital f/u appt confirmed? Yes  Scheduled to see Amber S. Dimas Aguas, FNP on 12/03/2022 @ [facility]. Specialist Hospital f/u appt confirmed? Yes  Scheduled to see Shaune Pollack, MD on 12/02/2022 @ [facility]. Are transportation arrangements needed? No  If their condition worsens, is the pt aware to call PCP or go to the Emergency Dept.? Yes Was the patient provided with contact information for the PCP's office or ED? Yes Was to pt encouraged to call back with questions or concerns? Yes  Billyjoe Go [facility]  [facility] Population Health Community Resource Care Guide   [email or web address]  ?? [phone]   Website: [email or web address]  [email or web address]

## 2022-12-05 NOTE — Telephone Encounter (Signed)
Pt was taking Keflex due to an injury to his L thumb. Pt stopped the the Keflex due to thinking it was causing dizziness. Pt saw Amber on 9/4 because the dizziness became worse again. From notes:  Symptoms overall improving with discontinuation of Cephalexin and Tramadol. Negative HINTS and Gilberto Better. Negative orthostatics. Instructed to return to office if symptoms return and persist.    Pt states his sx are worse again. Pt asks if he can get a refill on Meclizine. Pt states his previous Rx expired in 2022. Thanks.

## 2022-12-05 NOTE — Telephone Encounter (Signed)
error 

## 2022-12-08 ENCOUNTER — Encounter: Payer: Self-pay | Admitting: Family Medicine

## 2022-12-08 ENCOUNTER — Ambulatory Visit (INDEPENDENT_AMBULATORY_CARE_PROVIDER_SITE_OTHER): Payer: Medicare Other | Admitting: Family Medicine

## 2022-12-08 VITALS — BP 132/78 | HR 72 | Temp 97.6°F | Ht 74.0 in | Wt 251.0 lb

## 2022-12-08 DIAGNOSIS — R42 Dizziness and giddiness: Secondary | ICD-10-CM

## 2022-12-08 NOTE — Progress Notes (Signed)
Subjective:  HPI: Jose Owen is a 82 y.o. male presenting on 12/08/2022 for Acute Visit (dizziness/slj/)   HPI Patient is in today for ongoing dizziness that is worse at night for a little over a week. Describes as the room spinning and has caused nausea. He reports feeling "woozy" when he changes positions from lying to sitting or standing and with head position changes. He has none of these symptoms during the day other than feeling "sluggish" in the AM after the dizzy spells and a dull headache today. He denies vision changes, hearing loss, chest pain, shortness of breath, palpitations, presyncope. He has tried Meclizine with some relief.  Review of Systems  All other systems reviewed and are negative.   Relevant past medical history reviewed and updated as indicated.   Past Medical History:  Diagnosis Date   BPH (benign prostatic hyperplasia)      Past Surgical History:  Procedure Laterality Date   SKIN CANCER EXCISION  10/2020   right hand    Allergies and medications reviewed and updated.   Current Outpatient Medications:    acetaminophen (TYLENOL) 500 MG tablet, Take 500 mg by mouth as needed., Disp: , Rfl:    meclizine (ANTIVERT) 25 MG tablet, Take 1 tablet (25 mg total) by mouth 3 (three) times daily as needed for dizziness., Disp: 30 tablet, Rfl: 0   meloxicam (MOBIC) 7.5 MG tablet, TAKE 1 TABLET BY MOUTH EVERY DAY AS NEEDED FOR PAIN, Disp: 30 tablet, Rfl: 0   promethazine-dextromethorphan (PROMETHAZINE-DM) 6.25-15 MG/5ML syrup, Take 5 mLs by mouth 4 (four) times daily as needed for cough., Disp: 118 mL, Rfl: 0   tamsulosin (FLOMAX) 0.4 MG CAPS capsule, TAKE 1 CAPSULE BY MOUTH EVERY DAY, Disp: 90 capsule, Rfl: 3   vitamin B-12 (CYANOCOBALAMIN) 1000 MCG tablet, Take 1 tablet (1,000 mcg total) by mouth daily., Disp: , Rfl:   No Known Allergies  Objective:   BP 132/78   Pulse 72   Temp 97.6 F (36.4 C) (Oral)   Ht 6\' 2"  (1.88 m)   Wt 251 lb (113.9 kg)   SpO2  95%   BMI 32.23 kg/m      12/08/2022    2:18 PM 12/03/2022    9:18 AM 11/24/2022    1:45 PM  Vitals with BMI  Height 6\' 2"  6\' 2"    Weight 251 lbs 253 lbs   BMI 32.21 32.47   Systolic 132 128 578  Diastolic 78 82 99  Pulse 72 67 80     Physical Exam Vitals and nursing note reviewed.  Constitutional:      Appearance: Normal appearance. He is normal weight.  HENT:     Head: Normocephalic and atraumatic.  Cardiovascular:     Rate and Rhythm: Normal rate and regular rhythm.     Pulses: Normal pulses.     Heart sounds: Normal heart sounds.  Pulmonary:     Effort: Pulmonary effort is normal.     Breath sounds: Normal breath sounds.  Skin:    General: Skin is warm and dry.     Capillary Refill: Capillary refill takes less than 2 seconds.  Neurological:     General: No focal deficit present.     Mental Status: He is alert and oriented to person, place, and time. Mental status is at baseline.  Psychiatric:        Mood and Affect: Mood normal.        Behavior: Behavior normal.  Thought Content: Thought content normal.        Judgment: Judgment normal.     Assessment & Plan:  Vertigo -     PT Vestibular Evaluation; Future -     Ambulatory referral to Physical Therapy  Dizziness Assessment & Plan: Negative HINTS and Dix Hallpike. EKG NSR. Orthosttics negative. CMP and CBC done. Symptoms most consistent with Vertigo. Meclizine has helped symptoms, so continue this PRN. Will order vestibular rehab and counseled on how to perform the Epley maneuver. Instructed to return to office if symptoms persist or worsen.  Orders: -     EKG 12-Lead -     CBC with Differential/Platelet -     COMPLETE METABOLIC PANEL WITH GFR -     Ambulatory referral to Physical Therapy     Follow up plan: Return if symptoms worsen or fail to improve.  Park Meo, FNP

## 2022-12-08 NOTE — Assessment & Plan Note (Addendum)
Negative HINTS and Gilberto Better. EKG NSR. Orthosttics negative. CMP and CBC done. Symptoms most consistent with Vertigo. Meclizine has helped symptoms, so continue this PRN. Will order vestibular rehab and counseled on how to perform the Epley maneuver. Instructed to return to office if symptoms persist or worsen.

## 2022-12-09 DIAGNOSIS — S61012A Laceration without foreign body of left thumb without damage to nail, initial encounter: Secondary | ICD-10-CM | POA: Diagnosis not present

## 2022-12-09 LAB — COMPLETE METABOLIC PANEL WITH GFR
AG Ratio: 2 (calc) (ref 1.0–2.5)
ALT: 16 U/L (ref 9–46)
AST: 18 U/L (ref 10–35)
Albumin: 4.3 g/dL (ref 3.6–5.1)
Alkaline phosphatase (APISO): 67 U/L (ref 35–144)
BUN: 16 mg/dL (ref 7–25)
CO2: 24 mmol/L (ref 20–32)
Calcium: 9.7 mg/dL (ref 8.6–10.3)
Chloride: 107 mmol/L (ref 98–110)
Creat: 0.85 mg/dL (ref 0.70–1.28)
Globulin: 2.1 g/dL (ref 1.9–3.7)
Glucose, Bld: 93 mg/dL (ref 65–99)
Potassium: 4 mmol/L (ref 3.5–5.3)
Sodium: 141 mmol/L (ref 135–146)
Total Bilirubin: 0.8 mg/dL (ref 0.2–1.2)
Total Protein: 6.4 g/dL (ref 6.1–8.1)
eGFR: 88 mL/min/{1.73_m2} (ref >=60)

## 2022-12-09 LAB — CBC WITH DIFFERENTIAL/PLATELET
Absolute Monocytes: 499 {cells}/uL (ref 200–950)
Basophils Absolute: 70 {cells}/uL (ref 0–200)
Basophils Relative: 1.2 %
Eosinophils Absolute: 81 {cells}/uL (ref 15–500)
Eosinophils Relative: 1.4 %
HCT: 43.1 % (ref 38.5–50.0)
Hemoglobin: 14.8 g/dL (ref 13.2–17.1)
Lymphs Abs: 1404 {cells}/uL (ref 850–3900)
MCH: 31.6 pg (ref 27.0–33.0)
MCHC: 34.3 g/dL (ref 32.0–36.0)
MCV: 91.9 fL (ref 80.0–100.0)
MPV: 11.4 fL (ref 7.5–12.5)
Monocytes Relative: 8.6 %
Neutro Abs: 3747 {cells}/uL (ref 1500–7800)
Neutrophils Relative %: 64.6 %
Platelets: 235 10*3/uL (ref 140–400)
RBC: 4.69 10*6/uL (ref 4.20–5.80)
RDW: 12.7 % (ref 11.0–15.0)
Total Lymphocyte: 24.2 %
WBC: 5.8 10*3/uL (ref 3.8–10.8)

## 2022-12-12 ENCOUNTER — Telehealth: Payer: Self-pay | Admitting: Family Medicine

## 2022-12-12 MED ORDER — MECLIZINE HCL 25 MG PO TABS: 25.0000 mg | ORAL_TABLET | Freq: Three times a day (TID) | ORAL | 1 refills | Status: AC | PRN

## 2022-12-12 NOTE — Telephone Encounter (Signed)
Prescription Request  12/12/2022  LOV: 02/11/2022  What is the name of the medication or equipment?   meclizine (ANTIVERT) 25 MG tablet [[id number]]   Have you contacted your pharmacy to request a refill? Yes   Which pharmacy would you like this sent to?  [id number] [place], [place] - [address] [place] [address] Phone: [phone] Fax: [phone]    Patient notified that their request is being sent to the clinical staff for review and that they should receive a response within 2 business days.   Please advise patient at [phone].

## 2022-12-12 NOTE — Addendum Note (Signed)
Addended by: Arta Silence on: 12/12/2022 11:24 AM   Modules accepted: Orders

## 2022-12-15 ENCOUNTER — Ambulatory Visit (INDEPENDENT_AMBULATORY_CARE_PROVIDER_SITE_OTHER): Payer: Medicare Other | Admitting: Family Medicine

## 2022-12-15 VITALS — BP 118/62 | HR 93 | Temp 97.6°F | Ht 74.0 in | Wt 249.4 lb

## 2022-12-15 DIAGNOSIS — G8929 Other chronic pain: Secondary | ICD-10-CM

## 2022-12-15 DIAGNOSIS — R42 Dizziness and giddiness: Secondary | ICD-10-CM | POA: Diagnosis not present

## 2022-12-15 DIAGNOSIS — M25511 Pain in right shoulder: Secondary | ICD-10-CM

## 2022-12-15 MED ORDER — TRIAMCINOLONE ACETONIDE 40 MG/ML IJ SUSP
80.0000 mg | Freq: Once | INTRAMUSCULAR | Status: AC
Start: 2022-12-15 — End: 2022-12-15
  Administered 2022-12-15: 80 mg via INTRA_ARTICULAR

## 2022-12-15 MED ORDER — BUPIVACAINE HCL 0.25 % IJ SOLN
2.0000 mL | Freq: Once | INTRAMUSCULAR | Status: AC
Start: 2022-12-15 — End: 2022-12-15
  Administered 2022-12-15: 2 mL via INTRA_ARTICULAR

## 2022-12-15 MED ORDER — LIDOCAINE HCL (PF) 1 % IJ SOLN
2.0000 mL | Freq: Once | INTRAMUSCULAR | Status: AC
Start: 2022-12-15 — End: 2022-12-15
  Administered 2022-12-15: 2 mL

## 2022-12-15 NOTE — Progress Notes (Signed)
Subjective:    Patient ID: Jose Owen, male    DOB: 10/15/1940, 82 y.o.   MRN: [id number]  Patient reports several years of right shoulder pain.  He is unable to abduct his arm greater than 90 degrees.  He has pain with empty can maneuver.  He has pain with Hawkins maneuver.  He reports pain with overhead activities.  He is requesting another cortisone injection.  He also continues to suffer from vertigo.  He states that beginning on the right he, when he rolled over in bed, the room starts to spin.  It only occurs when he is lying supine and when he turns his head while lying supine.  During the day when he is upright he does not have frequent vertigo.  Symptoms been present for 3 weeks. Past Medical History:  Diagnosis Date   BPH (benign prostatic hyperplasia)    Past Surgical History:  Procedure Laterality Date   SKIN CANCER EXCISION  10/2020   right hand   Current Outpatient Medications on File Prior to Visit  Medication Sig Dispense Refill   acetaminophen (TYLENOL) 500 MG tablet Take 500 mg by mouth as needed.     meclizine (ANTIVERT) 25 MG tablet Take 1 tablet (25 mg total) by mouth 3 (three) times daily as needed for dizziness. 30 tablet 1   meloxicam (MOBIC) 7.5 MG tablet TAKE 1 TABLET BY MOUTH EVERY DAY AS NEEDED FOR PAIN 30 tablet 0   tamsulosin (FLOMAX) 0.4 MG CAPS capsule TAKE 1 CAPSULE BY MOUTH EVERY DAY 90 capsule 3   vitamin B-12 (CYANOCOBALAMIN) 1000 MCG tablet Take 1 tablet (1,000 mcg total) by mouth daily.     promethazine-dextromethorphan (PROMETHAZINE-DM) 6.25-15 MG/5ML syrup Take 5 mLs by mouth 4 (four) times daily as needed for cough. (Patient not taking: Reported on 12/15/2022) 118 mL 0   No current facility-administered medications on file prior to visit.   No Known Allergies Social History   Socioeconomic History   Marital status: Widowed    Spouse name: Not on file   Number of children: 2   Years of education: Not on file   Highest education level: Not  on file  Occupational History   Not on file  Tobacco Use   Smoking status: Former    Current packs/day: 0.00    Average packs/day: 0.5 packs/day for 9.0 years (4.5 ttl pk-yrs)    Types: Cigarettes    Start date: 03/31/1962    Quit date: 04/01/1971    Years since quitting: 51.7   Smokeless tobacco: Never  Substance and Sexual Activity   Alcohol use: No   Drug use: No   Sexual activity: Not Currently  Other Topics Concern   Not on file  Social History Narrative   Widowed 03/2018.   Social Determinants of Health   Financial Resource Strain: Low Risk  (03/12/2022)   Overall Financial Resource Strain (CARDIA)    Difficulty of Paying Living Expenses: Not hard at all  Food Insecurity: No Food Insecurity (03/12/2022)   Hunger Vital Sign    Worried About Running Out of Food in the Last Year: Never true    Ran Out of Food in the Last Year: Never true  Transportation Needs: No Transportation Needs (03/12/2022)   PRAPARE - Administrator, Civil Service (Medical): No    Lack of Transportation (Non-Medical): No  Physical Activity: Inactive (03/12/2022)   Exercise Vital Sign    Days of Exercise per Week: 0 days  Minutes of Exercise per Session: 0 min  Stress: No Stress Concern Present (03/12/2022)   [employer] of Occupational Health - Occupational Stress Questionnaire    Feeling of Stress : Only a little  Social Connections: Unknown (03/12/2022)   Social Connection and Isolation Panel [NHANES]    Frequency of Communication with Friends and Family: Not on file    Frequency of Social Gatherings with Friends and Family: Not on file    Attends Religious Services: More than 4 times per year    Active Member of [employer] or Organizations: Not on file    Attends Banker Meetings: Not on file    Marital Status: Not on file  Intimate Partner Violence: Not At Risk (03/12/2022)   Humiliation, Afraid, Rape, and Kick questionnaire    Fear of Current or Ex-Partner: No     Emotionally Abused: No    Physically Abused: No    Sexually Abused: No   No family history on file. Father has a history of atrial fibrillation and prostate cancer.  Mother died due to old age.  He does have a family history of familial lipomatosis  Review of Systems  All other systems reviewed and are negative.      Objective:   Physical Exam Vitals reviewed.  Constitutional:      General: He is not in acute distress.    Appearance: Normal appearance. He is obese. He is not ill-appearing, toxic-appearing or diaphoretic.  HENT:     Head: Normocephalic and atraumatic.  Cardiovascular:     Rate and Rhythm: Normal rate and regular rhythm.     Pulses: Normal pulses.     Heart sounds: Normal heart sounds. No murmur heard.    No friction rub. No gallop.  Pulmonary:     Effort: Pulmonary effort is normal. No respiratory distress.     Breath sounds: Normal breath sounds. No stridor. No wheezing, rhonchi or rales.  Chest:     Chest wall: No tenderness.  Musculoskeletal:     Right shoulder: Bony tenderness present. Decreased range of motion. Decreased strength.     Cervical back: No tenderness.  Neurological:     General: No focal deficit present.     Mental Status: He is alert and oriented to person, place, and time. Mental status is at baseline.           Assessment & Plan:  Vertigo - Plan: Ambulatory referral to Physical Therapy  Chronic right shoulder pain  I believe this is due to a combination of glenohumeral arthritis and likely tendinitis in the rotator cuff.  Using sterile technique, I injected the right subacromial space with 2 cc lidocaine, 2 cc of Marcaine, and 2 cc of 40 mg/mL Kenalog.  The patient tolerated the procedure well without complication.   Consult physical therapy for Epley maneuvers

## 2022-12-15 NOTE — Addendum Note (Signed)
Addended by: Venia Carbon K on: 12/15/2022 03:08 PM   Modules accepted: Orders

## 2022-12-19 ENCOUNTER — Encounter: Payer: Self-pay | Admitting: Physical Therapy

## 2022-12-19 ENCOUNTER — Other Ambulatory Visit: Payer: Self-pay

## 2022-12-19 ENCOUNTER — Ambulatory Visit: Payer: Medicare Other | Attending: Family Medicine | Admitting: Physical Therapy

## 2022-12-19 VITALS — BP 120/69 | HR 68

## 2022-12-19 DIAGNOSIS — R2681 Unsteadiness on feet: Secondary | ICD-10-CM | POA: Diagnosis not present

## 2022-12-19 DIAGNOSIS — R42 Dizziness and giddiness: Secondary | ICD-10-CM | POA: Insufficient documentation

## 2022-12-19 NOTE — Therapy (Unsigned)
OUTPATIENT PHYSICAL THERAPY VESTIBULAR EVALUATION    Patient Name: Jose Owen MRN: [id number] DOB:1940-05-24, 82 y.o., male Today's Date: 12/22/2022  END OF SESSION:   PT End of Session - 12/19/22 0844     Visit Number 1    Number of Visits 7   including eval   Date for PT Re-Evaluation 01/23/23    Authorization Type [employer]    PT Start Time [phone]    PT Stop Time 0930    PT Time Calculation (min) 48 min    Equipment Utilized During Treatment Other (comment)   performed at seated level   Activity Tolerance Patient tolerated treatment well    Behavior During Therapy [facility] for tasks assessed/performed             Past Medical History:  Diagnosis Date   BPH (benign prostatic hyperplasia)    Past Surgical History:  Procedure Laterality Date   SKIN CANCER EXCISION  10/2020   right hand   Patient Active Problem List   Diagnosis Date Noted   Dizziness 12/03/2022   Laceration of left thumb, initial encounter 12/02/2022   Bacterial sinusitis 05/14/2022   Acute pain of right knee 09/02/2020   BPH (benign prostatic hyperplasia)    Fatigue 12/05/2016    PCP: Donita Brooks, MD REFERRING PROVIDER: Donita Brooks, MD  REFERRING DIAG: R42 (ICD-10-CM) - Vertigo  THERAPY DIAG:  Dizziness and giddiness - Plan: PT plan of care cert/re-cert  Unsteadiness on feet - Plan: PT plan of care cert/re-cert  ONSET DATE: 12/15/2022 (referral date)  Rationale for Evaluation and Treatment: Rehabilitation  SUBJECTIVE:   SUBJECTIVE STATEMENT: Patient reports onset of acute vertigo ~3 weeks ago. Patient reports that he woke up with vertigo. He reports that he experiences symptoms everytime he rolls onto the R side. Patient is fine if he is on his L side. Patient reports chronic hearing loss on the R side but this occurred prior to onset of vertigo. Patient denies any head imaging or audiology studies. Patient reports that he last took meclizine approximately  3 am this morning. Reports off/on history of vertigo ~3-4 years ago when he went to R side. Reports total of 3 prior episodes of vertigo. Patient also report occasional symptoms when looking up as well.   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 No  PRECAUTIONS: Fall  RED FLAGS: None   WEIGHT BEARING RESTRICTIONS: No  FALLS: Has patient fallen in last 6 months? Yes. Number of falls 1 fall in yard due to stepping in hole, able to get up by self by crawling to something he grab onto resulting in open fx of L thumb  LIVING ENVIRONMENT: Lives with: lives alone Lives in: House/apartment Stairs: Yes: Internal: 20 steps; on left going up and External: 3-4 steps; on right going up Has following equipment at home: Single point cane, Walker - 2 wheeled, shower chair, and Grab bars  PLOF: Independent - retired from working in [employer]  PATIENT GOALS: "Get rid of this vertigo."  OBJECTIVE:   DIAGNOSTIC FINDINGS: No relevant recent imaging  COGNITION: Overall cognitive status: Within functional limits for tasks assessed   SENSATION: WFL  EDEMA:  Absent  Cervical ROM:    Active A/PROM (deg) eval  Flexion WFL  Extension WFL  Right lateral flexion WFL  Left lateral flexion [facility]  Right rotation WFL  Left rotation WFL  (Blank rows = not tested  GAIT: Gait pattern: step through pattern and minor signs of instability Distance walked: 2 x 60 feet Assistive device utilized: None Level of assistance: SBA Comments: guarding with head and boy turns, minor use of walls for stability with turns  PATIENT SURVEYS:  FOTO 39  VESTIBULAR ASSESSMENT:  GENERAL OBSERVATION: bifocal donned, ambulating without AD, min signs of instability   SYMPTOM BEHAVIOR:  Subjective history: see above  Non-Vestibular symptoms: headaches, tinnitus, and  nausea/vomiting Type of dizziness: Imbalance (Disequilibrium), Spinning/Vertigo, Unsteady with head/body turns, Lightheadedness/Faint, "Funny feeling in the head", "World moves", and "Swimmyheaded"  Frequency: almost every night Duration: clears up in about 3 hours with meclizine or all day without for feeling off, vertigo lasts a few seconds  Aggravating factors: rolling to R, looking up  Relieving factors: rest and sitting still  Progression of symptoms: unchanged  OCULOMOTOR EXAM:  Ocular Alignment: normal  Ocular ROM: No Limitations  Spontaneous Nystagmus: absent  Gaze-Induced Nystagmus: absent Smooth Pursuits: intact with mild beat of nystagmus noted at end range but within functional limits  Saccades: slow mild decrease in speed  Convergence/Divergence: ~ 7cm Test of Skew: WFL VBI: WFL  VESTIBULAR - OCULAR REFLEX:   Slow VOR: Normal  VOR Cancellation: Normal Head-Impulse Test: HIT Right: mild correction consistently to R, possibly mild positive HIT Left: negative  Dynamic Visual Acuity: To be assessed   POSITIONAL TESTING:  Right Dix-Hallpike: upbeating, right nystagmus Left Dix-Hallpike: mild rotary small amplitude with possible slight downbeat Right Roll Test: no nystagmus Left Roll Test: no nystagmus  Duration ~30 seconds bilaterally with < 10 second delay  MOTION SENSITIVITY:  Motion Sensitivity Quotient Intensity: 0 = none, 1 = Lightheaded, 2 = Mild, 3 = Moderate, 4 = Severe, 5 = Vomiting  Intensity  1. Sitting to supine   2. Supine to L side   3. Supine to R side   4. Supine to sitting   5. L Hallpike-Dix 2  6. Up from L  2  7. R Hallpike-Dix 4  8. Up from R  2  9. Sitting, head tipped to L knee 0  10. Head up from L knee 1  11. Sitting, head tipped to R knee 2  12. Head up from R knee 3  13. Sitting head turns x5 1  14.Sitting head nods x5 0  15. In stance, 180 turn to L    16. In stance, 180 turn to R      VESTIBULAR TREATMENT:                                                                                                     Canalith Repositioning: Epley Right: Number of Reps: 2, Response to Treatment: symptoms improved, and Comment: patient reported symptoms in position 1 and 2 with signs of nystagmus, no major report in position 3, reduction from 30 second duration to ~20 seconds on second rep but recommend re-evaluating in future sessions  PATIENT EDUCATION: Education details: POC, goal collaboration, examination findings Person educated: Patient  and friend Education method: Explanation Education comprehension: verbalized understanding and needs further education  HOME EXERCISE PROGRAM:  To be provided as indicated  GOALS: Goals reviewed with patient? Yes  LONG TERM GOALS: Target date: 01/23/2023  Patient will report demonstrate independence with final HEP in order to maintain current gains and continue to progress after physical therapy discharge.   Baseline: As indicated Goal status: INITIAL  2.  Patient will present with negative positional testing to indicate resolution of BPPV. Baseline: Positive R Dix Hallpike Goal status: INITIAL  3.  mCTSIB to be assessed / LTG as written as indicated Baseline: To be provided Goal status: INITIAL  4.  Patient will improve FOTO score to 56 to achieve predicted improvements in functional mobility due to skilled physical therapy interventions to increase safety with and participation in daily activities.  Baseline: 39 Goal status: INITIAL  ASSESSMENT:  CLINICAL IMPRESSION: Patient is a 82 y.o. male who was seen today for physical therapy evaluation and treatment for vertigo with consistent occurrence when patient rolls to R side. Patient occulomotor screen largely unremarkable with only slight deficit in convergence; patient also presented with slight correction with head thrust to right indicating possible very mild hypofunction on this side; however, examination findings most  consistent with R posterior canalithiasis. Patient noted to have slight small amplitude rotatory nystagmus with possible slight downbeat with L Gilberto Better but recommend re-evaluating in future sessions. Patient will benefit from skilled physical therapy services to address deficits noted above.   OBJECTIVE IMPAIRMENTS: decreased balance and dizziness.   ACTIVITY LIMITATIONS: bending and bed mobility  PARTICIPATION LIMITATIONS: community activity and yard work  PERSONAL FACTORS: Age and Time since onset of injury/illness/exacerbation are also affecting patient's functional outcome.   REHAB POTENTIAL: Good  CLINICAL DECISION MAKING: Stable/uncomplicated  EVALUATION COMPLEXITY: Low   PLAN:  PT FREQUENCY: 2x/week  PT DURATION: 5 weeks  PLANNED INTERVENTIONS: Therapeutic exercises, Therapeutic activity, Neuromuscular re-education, Balance training, Gait training, Patient/Family education, Self Care, Vestibular training, Canalith repositioning, Manual therapy, and Re-evaluation  PLAN FOR NEXT SESSION: assess R Dix Hallpike and treat as indicated, also retest L once R resolved, consider referral for audiology for reports of chronic reduction in R hearing (prior to vertigo episodes), assess mCTSIB and write goal as indicated and habituation HEP if needed   Carmelia Bake, PT 12/22/2022, 8:35 AM

## 2022-12-22 ENCOUNTER — Encounter: Payer: Self-pay | Admitting: Physical Therapy

## 2022-12-23 DIAGNOSIS — S61012A Laceration without foreign body of left thumb without damage to nail, initial encounter: Secondary | ICD-10-CM | POA: Diagnosis not present

## 2022-12-24 ENCOUNTER — Ambulatory Visit: Payer: Medicare Other

## 2022-12-24 DIAGNOSIS — R42 Dizziness and giddiness: Secondary | ICD-10-CM

## 2022-12-24 DIAGNOSIS — R2681 Unsteadiness on feet: Secondary | ICD-10-CM | POA: Diagnosis not present

## 2022-12-24 NOTE — Therapy (Signed)
OUTPATIENT PHYSICAL THERAPY VESTIBULAR EVALUATION    Patient Name: Jose Owen MRN: [id number] DOB:Oct 07, 1940, 82 y.o., male Today's Date: 12/24/2022  END OF SESSION:  PT End of Session - 12/24/22 0753     Visit Number 2    Number of Visits 7    Date for PT Re-Evaluation 01/23/23    Authorization Type [employer] Medicare    PT Start Time 0800    PT Stop Time 0840    PT Time Calculation (min) 40 min    Activity Tolerance Patient tolerated treatment well    Behavior During Therapy [facility] for tasks assessed/performed             PT End of Session - 12/19/22 0844     Visit Number 1    Number of Visits 7   including eval   Date for PT Re-Evaluation 01/23/23    Authorization Type [employer] Medicare    PT Start Time [phone]    PT Stop Time 0930    PT Time Calculation (min) 48 min    Equipment Utilized During Treatment Other (comment)   performed at seated level   Activity Tolerance Patient tolerated treatment well    Behavior During Therapy [facility] for tasks assessed/performed             Past Medical History:  Diagnosis Date   BPH (benign prostatic hyperplasia)    Past Surgical History:  Procedure Laterality Date   SKIN CANCER EXCISION  10/2020   right hand   Patient Active Problem List   Diagnosis Date Noted   Dizziness 12/03/2022   Laceration of left thumb, initial encounter 12/02/2022   Bacterial sinusitis 05/14/2022   Acute pain of right knee 09/02/2020   BPH (benign prostatic hyperplasia)    Fatigue 12/05/2016    PCP: Donita Brooks, MD REFERRING PROVIDER: Donita Brooks, MD  REFERRING DIAG: R42 (ICD-10-CM) - Vertigo  THERAPY DIAG:  Dizziness and giddiness  Unsteadiness on feet  ONSET DATE: 12/15/2022 (referral date)  Rationale for Evaluation and Treatment: Rehabilitation  SUBJECTIVE:   SUBJECTIVE STATEMENT: Patient reports doing fair. Didn't take his meclizine and so reports that every time he would turn his head  to the R he would get very dizzy. Denies nausea. Denies falls.   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 No  PRECAUTIONS: Fall  PATIENT GOALS: "Get rid of this vertigo."  VESTIBULAR ASSESSMENT   POSITIONAL TESTING:  Right Dix-Hallpike: no nystagmus and reported feeling the world spin Right Roll Test: R upbeating torsional nystagmus, which is more consistent with R posterior canal Left Roll Test: no nystagmus   MOTION SENSITIVITY:  Motion Sensitivity Quotient Intensity: 0 = none, 1 = Lightheaded, 2 = Mild, 3 = Moderate, 4 = Severe, 5 = Vomiting  Intensity  1. Sitting to supine 0  2. Supine to L side   3. Supine to R side   4. Supine to sitting 0  5. L Hallpike-Dix 2  6. Up from L  2  7. R Hallpike-Dix 4  8. Up from R  2  9. Sitting, head tipped to L knee 0  10. Head up from L knee 1  11. Sitting, head tipped to R knee 2  12. Head up from R knee 3  13. Sitting head turns x5 1  14.Sitting head  nods x5 0  15. In stance, 180 turn to L    16. In stance, 180 turn to R      VESTIBULAR TREATMENT:                                                                                                    Canalith Repositioning: Epley Right: Number of Reps: 3, Response to Treatment: symptoms resolved, and Comment: 1st rep: mild nystagmus in position 1, 2, none in 3; 2nd rep: mild nystagmus position 1, more intense position 2, none position 3; 3rd rep: no nystagmus in 2 or 3  PATIENT EDUCATION: Education details: exam findings, Austin Miles, adequate hydration, pathophys of BPPV Person educated: Patient Education method: Explanation and Handouts Education comprehension: verbalized understanding and needs further education  HOME EXERCISE PROGRAM:  Sit to Side-Lying    Sit on edge of bed. 1. Turn head 45 to right. 2. Maintain head position and  lie down slowly on left side. Hold until symptoms subside. 3. Sit up slowly. Hold until symptoms subside. 4. Turn head 45 to left. 5. Maintain head position and lie down slowly on right side. Hold until symptoms subside. 6. Sit up slowly. Repeat sequence ___3_ times each side per session. Do _1___ session per day. Do all repetitions to 1 side before doing the other side.   GOALS: Goals reviewed with patient? Yes  LONG TERM GOALS: Target date: 01/23/2023  Patient will report demonstrate independence with final HEP in order to maintain current gains and continue to progress after physical therapy discharge.   Baseline: As indicated Goal status: INITIAL  2.  Patient will present with negative positional testing to indicate resolution of BPPV. Baseline: Positive R Dix Hallpike Goal status: INITIAL  3.  mCTSIB to be assessed / LTG as written as indicated Baseline: To be provided Goal status: INITIAL  4.  Patient will improve FOTO score to 56 to achieve predicted improvements in functional mobility due to skilled physical therapy interventions to increase safety with and participation in daily activities.  Baseline: 39 Goal status: INITIAL  ASSESSMENT:  CLINICAL IMPRESSION: Patient seen for skilled PT session with emphasis on rechecking R posterior canal BPPV. Patient with interesting and unexpected presentation of nystagmus consistent with R posterior canal BPPV, but this presented when testing R horizontal canal. Treated R posterior canal given symptom report as well and this resolved with epley. Would benefit from further investigation of horizontal canal. Continue POC.   OBJECTIVE IMPAIRMENTS: decreased balance and dizziness.   ACTIVITY LIMITATIONS: bending and bed mobility  PARTICIPATION LIMITATIONS: community activity and yard work  PERSONAL FACTORS: Age and Time since onset of injury/illness/exacerbation are also affecting patient's functional outcome.   REHAB POTENTIAL:  Good  CLINICAL DECISION MAKING: Stable/uncomplicated  EVALUATION COMPLEXITY: Low   PLAN:  PT FREQUENCY: 2x/week  PT DURATION: 5 weeks  PLANNED INTERVENTIONS: Therapeutic exercises, Therapeutic activity, Neuromuscular re-education, Balance training, Gait training, Patient/Family education, Self Care, Vestibular training, Canalith repositioning, Manual therapy, and Re-evaluation  PLAN FOR NEXT SESSION: assess R Dix Hallpike and treat as indicated, also retest L once  R resolved, consider referral for audiology for reports of chronic reduction in R hearing (prior to vertigo episodes), assess mCTSIB and write goal as indicated and habituation HEP if needed   Westley Foots, PT Westley Foots, PT, DPT, CBIS  12/24/2022, 9:16 AM

## 2022-12-26 ENCOUNTER — Encounter: Payer: Self-pay | Admitting: Physical Therapy

## 2022-12-26 ENCOUNTER — Telehealth: Payer: Self-pay | Admitting: Physical Therapy

## 2022-12-26 ENCOUNTER — Ambulatory Visit: Payer: Medicare Other | Admitting: Physical Therapy

## 2022-12-26 VITALS — BP 103/59 | HR 92

## 2022-12-26 DIAGNOSIS — R42 Dizziness and giddiness: Secondary | ICD-10-CM | POA: Diagnosis not present

## 2022-12-26 DIAGNOSIS — R2681 Unsteadiness on feet: Secondary | ICD-10-CM | POA: Diagnosis not present

## 2022-12-26 MED ORDER — LIDOCAINE HCL (PF) 1 % IJ SOLN
2.0000 mL | Freq: Once | INTRAMUSCULAR | Status: AC
Start: 2022-12-26 — End: 2022-12-18
  Administered 2022-12-18: 2 mL

## 2022-12-26 MED ORDER — TRIAMCINOLONE ACETONIDE 40 MG/ML IJ SUSP
80.0000 mg | Freq: Once | INTRAMUSCULAR | Status: AC
Start: 2022-12-26 — End: 2022-12-18
  Administered 2022-12-18: 80 mg via INTRA_ARTICULAR

## 2022-12-26 MED ORDER — BUPIVACAINE HCL 0.25 % IJ SOLN
2.0000 mL | Freq: Once | INTRAMUSCULAR | Status: AC
Start: 2022-12-26 — End: 2022-12-19
  Administered 2022-12-19: 2 mL via INTRA_ARTICULAR

## 2022-12-26 NOTE — Addendum Note (Signed)
Addended by: Venia Carbon K on: 12/26/2022 04:11 PM   Modules accepted: Orders

## 2022-12-26 NOTE — Telephone Encounter (Signed)
Dr. Tanya Nones,  Jose Owen is being treated for vertigo in physical therapy. The patient reported 10lb weight loss over the last 4 weeks which is unusual for him and unintentional. We recommend that patient follow up with you, but if you would reach out as well, that would be greatly appreciated.  Thank you, Maryruth Eve, PT, DPT  [facility] [address] [place], [place]  [address] Phone:  [phone] Fax:  [phone]

## 2022-12-26 NOTE — Therapy (Signed)
OUTPATIENT PHYSICAL THERAPY VESTIBULAR TREATMENT    Patient Name: Jose Owen MRN: [id number] DOB:May 16, 1940, 82 y.o., male Today's Date: 12/26/2022  END OF SESSION:  PT End of Session - 12/26/22 0838     Visit Number 3    Number of Visits 7    Date for PT Re-Evaluation 01/23/23    Authorization Type [employer] [place] Medicare    PT Start Time [phone]    PT Stop Time 0915    PT Time Calculation (min) 38 min    Equipment Utilized During Treatment Other (comment)   performed at mat level   Activity Tolerance Patient tolerated treatment well    Behavior During Therapy [facility] for tasks assessed/performed             PT End of Session - 12/19/22 0844     Visit Number 1    Number of Visits 7   including eval   Date for PT Re-Evaluation 01/23/23    Authorization Type [employer] Medicare    PT Start Time [phone]    PT Stop Time 0930    PT Time Calculation (min) 48 min    Equipment Utilized During Treatment Other (comment)   performed at seated level   Activity Tolerance Patient tolerated treatment well    Behavior During Therapy [facility] for tasks assessed/performed             Past Medical History:  Diagnosis Date   BPH (benign prostatic hyperplasia)    Past Surgical History:  Procedure Laterality Date   SKIN CANCER EXCISION  10/2020   right hand   Patient Active Problem List   Diagnosis Date Noted   Dizziness 12/03/2022   Laceration of left thumb, initial encounter 12/02/2022   Bacterial sinusitis 05/14/2022   Acute pain of right knee 09/02/2020   BPH (benign prostatic hyperplasia)    Fatigue 12/05/2016    PCP: Jose Brooks, MD REFERRING PROVIDER: Donita Brooks, MD  REFERRING DIAG: R42 (ICD-10-CM) - Vertigo  THERAPY DIAG:  Dizziness and giddiness  Unsteadiness on feet  ONSET DATE: 12/15/2022 (referral date)  Rationale for Evaluation and Treatment: Rehabilitation  SUBJECTIVE:   SUBJECTIVE STATEMENT: Patient not taking meclizine  today. Denies nausea. Denies falls. Reports dizziness is not too bad but elevating pillow to minimize symptoms.   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 No  PRECAUTIONS: Fall  PATIENT GOALS: "Get rid of this vertigo."  VESTIBULAR ASSESSMENT   POSITIONAL TESTING:  Right Dix-Hallpike: strong fast upbeat right torsional nystagmus that lasted ~10 seconds with < 5 seconds duration, reduced amplitude and shifted to small amplitude torsional nystagmus with possible mild down beat Right Roll Test: a few beats of right torsional nystagmus that lasted < 5 seconds more consistent with posterior canal BPPV Left Roll Test: no nystagmus  VESTIBULAR TREATMENT:  Vitals:   12/26/22 0842  BP: (!) 103/59  Pulse: 92   Canalith Repositioning: Epley Right: Number of Reps: 4, Response to Treatment: symptoms resolved, and Comment: 1st rep: strong nystagmus in position 1, 2 minimal in position 3; reps 2-4 that dissipated with repetition with small amplitude nystgamus noted in each position   TherAct: Patient reported 10lb weight loss over 4 weeks; recommend following up with PCP and therapist to follow up, continue HEP as able recommendation, discussed findings and POC moving forward, patient verbalized understanding  PATIENT EDUCATION: Education details: Continue HEP + follow up with PCP regarding weight loss Person educated: Patient Education method: Explanation and Handouts Education comprehension: verbalized understanding and needs further education  HOME EXERCISE PROGRAM:  Sit to Side-Lying    Sit on edge of bed. 1. Turn head 45 to right. 2. Maintain head position and lie down slowly on left side. Hold until symptoms subside. 3. Sit up slowly. Hold until symptoms  subside. 4. Turn head 45 to left. 5. Maintain head position and lie down slowly on right side. Hold until symptoms subside. 6. Sit up slowly. Repeat sequence ___3_ times each side per session. Do _1___ session per day. Do all repetitions to 1 side before doing the other side.   GOALS: Goals reviewed with patient? Yes  LONG TERM GOALS: Target date: 01/23/2023  Patient will report demonstrate independence with final HEP in order to maintain current gains and continue to progress after physical therapy discharge.   Baseline: As indicated Goal status: INITIAL  2.  Patient will present with negative positional testing to indicate resolution of BPPV. Baseline: Positive R Dix Hallpike Goal status: INITIAL  3.  mCTSIB to be assessed / LTG as written as indicated Baseline: To be provided Goal status: INITIAL  4.  Patient will improve FOTO score to 56 to achieve predicted improvements in functional mobility due to skilled physical therapy interventions to increase safety with and participation in daily activities.  Baseline: 39 Goal status: INITIAL  ASSESSMENT:  CLINICAL IMPRESSION: Patient seen for skilled PT session with emphasis on assessment and treatment of R posterior canal BPPV. Patient with more typical presentation of nystagmus consistent with R posterior canal BPPV in today's session though continues to have atypical beats in right roll test that present more similar to posterior canal involvement. Treated R posterior canal that improved with repetition. Patient also reporting 10lb weight loss over 4 weeks in today's session so recommend follow up with PCP for further evaluation. Continue POC.   OBJECTIVE IMPAIRMENTS: decreased balance and dizziness.   ACTIVITY LIMITATIONS: bending and bed mobility  PARTICIPATION LIMITATIONS: community activity and yard work  PERSONAL FACTORS: Age and Time since onset of injury/illness/exacerbation are also affecting patient's functional outcome.    REHAB POTENTIAL: Good  CLINICAL DECISION MAKING: Stable/uncomplicated  EVALUATION COMPLEXITY: Low   PLAN:  PT FREQUENCY: 2x/week  PT DURATION: 5 weeks  PLANNED INTERVENTIONS: Therapeutic exercises, Therapeutic activity, Neuromuscular re-education, Balance training, Gait training, Patient/Family education, Self Care, Vestibular training, Canalith repositioning, Manual therapy, and Re-evaluation  PLAN FOR NEXT SESSION: assess R Dix Hallpike and treat as indicated, also retest L once R resolved, consider referral for audiology for reports of chronic reduction in R hearing (prior to vertigo episodes), assess mCTSIB and write goal as indicated and habituation HEP if needed   Carmelia Bake, PT, DPT 12/26/2022, 9:33 AM

## 2022-12-30 ENCOUNTER — Ambulatory Visit: Payer: Medicare Other | Attending: Family Medicine

## 2022-12-30 DIAGNOSIS — R2681 Unsteadiness on feet: Secondary | ICD-10-CM | POA: Diagnosis not present

## 2022-12-30 DIAGNOSIS — R42 Dizziness and giddiness: Secondary | ICD-10-CM | POA: Diagnosis not present

## 2022-12-30 NOTE — Therapy (Signed)
OUTPATIENT PHYSICAL THERAPY VESTIBULAR TREATMENT    Patient Name: Jose Owen MRN: [id number] DOB:23-Nov-1940, 82 y.o., male Today's Date: 12/30/2022  END OF SESSION:  PT End of Session - 12/30/22 0801     Visit Number 4    Number of Visits 7    Date for PT Re-Evaluation 01/23/23    Authorization Type [employer] Medicare    PT Start Time 0801    PT Stop Time 0840    PT Time Calculation (min) 39 min    Activity Tolerance Patient tolerated treatment well    Behavior During Therapy [facility] for tasks assessed/performed              Past Medical History:  Diagnosis Date   BPH (benign prostatic hyperplasia)    Past Surgical History:  Procedure Laterality Date   SKIN CANCER EXCISION  10/2020   right hand   Patient Active Problem List   Diagnosis Date Noted   Dizziness 12/03/2022   Laceration of left thumb, initial encounter 12/02/2022   Bacterial sinusitis 05/14/2022   Acute pain of right knee 09/02/2020   BPH (benign prostatic hyperplasia)    Fatigue 12/05/2016    PCP: Donita Brooks, MD REFERRING PROVIDER: Donita Brooks, MD  REFERRING DIAG: R42 (ICD-10-CM) - Vertigo  THERAPY DIAG:  Dizziness and giddiness  Unsteadiness on feet  ONSET DATE: 12/15/2022 (referral date)  Rationale for Evaluation and Treatment: Rehabilitation  SUBJECTIVE:   SUBJECTIVE STATEMENT: Patient reports doing fair. Has had episodes of dizziness that come on randomly when walking. Denies falls.   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 No  PRECAUTIONS: Fall  PATIENT GOALS: "Get rid of this vertigo."  VESTIBULAR ASSESSMENT   POSITIONAL TESTING:  Right Dix-Hallpike: upbeating, right nystagmus and mild amplitude on initial check  VESTIBULAR TREATMENT:                                                                                                     There were no vitals filed for this visit.  Canalith Repositioning: Epley Right: Number of Reps: 4, Response to Treatment: symptoms improved, and Comment: added vibration to R mastoid from treatment 2 on, trendelenburg of bed treatment 3 on    PATIENT EDUCATION: Education details: Continue HEP, prioritizing R brandt daroff Person educated: Patient Education method: Explanation and Handouts Education comprehension: verbalized understanding and needs further education  HOME EXERCISE PROGRAM:  Sit to Side-Lying    Sit on edge of bed. 1. Turn head 45 to right. 2. Maintain head position and lie down slowly on left side. Hold until symptoms subside. 3. Sit up slowly. Hold until symptoms subside. 4. Turn head 45 to left. 5. Maintain head position and lie down slowly on right side. Hold until symptoms subside. 6. Sit up slowly. Repeat sequence ___3_ times each side per session. Do _1___ session per day. Do all repetitions to 1 side before doing the other side.  GOALS: Goals reviewed with patient? Yes  LONG TERM GOALS: Target date: 01/23/2023  Patient will report demonstrate independence with final HEP in order to maintain current gains and continue to progress after physical therapy discharge.   Baseline: As indicated Goal status: INITIAL  2.  Patient will present with negative positional testing to indicate resolution of BPPV. Baseline: Positive R Dix Hallpike Goal status: INITIAL  3.  mCTSIB to be assessed / LTG as written as indicated Baseline: To be provided Goal status: INITIAL  4.  Patient will improve FOTO score to 56 to achieve predicted improvements in functional mobility due to skilled physical therapy interventions to increase safety with and participation in daily activities.  Baseline: 39 Goal status: INITIAL  ASSESSMENT:  CLINICAL IMPRESSION: Patient seen for skilled PT session with emphasis on assessment and treatment of R posterior canal  BPPV. Present again today on re-check, but much lower amplitude than previously. Tolerated multiple rounds of the Epley maneuver with bed in trendelenburg as well as with added vibration to R mastoid process. Upon final re-check, patient still with very faint amplitude R torsional upbeating nystagmus. He [place] require semont maneuver to clear.   OBJECTIVE IMPAIRMENTS: decreased balance and dizziness.   ACTIVITY LIMITATIONS: bending and bed mobility  PARTICIPATION LIMITATIONS: community activity and yard work  PERSONAL FACTORS: Age and Time since onset of injury/illness/exacerbation are also affecting patient's functional outcome.   REHAB POTENTIAL: Good  CLINICAL DECISION MAKING: Stable/uncomplicated  EVALUATION COMPLEXITY: Low   PLAN:  PT FREQUENCY: 2x/week  PT DURATION: 5 weeks  PLANNED INTERVENTIONS: Therapeutic exercises, Therapeutic activity, Neuromuscular re-education, Balance training, Gait training, Patient/Family education, Self Care, Vestibular training, Canalith repositioning, Manual therapy, and Re-evaluation  PLAN FOR NEXT SESSION: assess R Dix Hallpike and treat as indicated, also retest L once R resolved, consider referral for audiology for reports of chronic reduction in R hearing (prior to vertigo episodes), assess mCTSIB and write goal as indicated and habituation HEP if needed, [place] NEED SEMONT MANEUVER   Westley Foots, PT Westley Foots, PT, DPT, CBIS  12/30/2022, 9:25 AM

## 2023-01-02 ENCOUNTER — Ambulatory Visit: Payer: Medicare Other | Admitting: Physical Therapy

## 2023-01-02 VITALS — BP 118/68 | HR 60

## 2023-01-02 DIAGNOSIS — R2681 Unsteadiness on feet: Secondary | ICD-10-CM | POA: Diagnosis not present

## 2023-01-02 DIAGNOSIS — R42 Dizziness and giddiness: Secondary | ICD-10-CM | POA: Diagnosis not present

## 2023-01-02 NOTE — Therapy (Signed)
OUTPATIENT PHYSICAL THERAPY VESTIBULAR TREATMENT / DISCHARGE  PHYSICAL THERAPY DISCHARGE SUMMARY  Visits from Start of Care: 5  Current functional level related to goals / functional outcomes: Resolution of symptoms noted   Remaining deficits: None major to note at this time   Education / Equipment: How to return to PT if has re-occurence   Patient agrees to discharge. Patient goals were met. Patient is being discharged due to meeting the stated rehab goals.   Patient Name: Jose Owen MRN: [id number] DOB:Jun 16, 1940, 82 y.o., male Today's Date: 01/02/2023  END OF SESSION:  PT End of Session - 01/02/23 0804     Visit Number 5    Number of Visits 7    Date for PT Re-Evaluation 01/23/23    Authorization Type [employer]    PT Start Time [phone]    PT Stop Time 0825    PT Time Calculation (min) 22 min    Equipment Utilized During Treatment Other (comment)    Activity Tolerance Patient tolerated treatment well    Behavior During Therapy [facility] for tasks assessed/performed             Past Medical History:  Diagnosis Date   BPH (benign prostatic hyperplasia)    Past Surgical History:  Procedure Laterality Date   SKIN CANCER EXCISION  10/2020   right hand   Patient Active Problem List   Diagnosis Date Noted   Dizziness 12/03/2022   Laceration of left thumb, initial encounter 12/02/2022   Bacterial sinusitis 05/14/2022   Acute pain of right knee 09/02/2020   BPH (benign prostatic hyperplasia)    Fatigue 12/05/2016    PCP: Donita Brooks, MD REFERRING PROVIDER: Donita Brooks, MD  REFERRING DIAG: R42 (ICD-10-CM) - Vertigo  THERAPY DIAG:  Dizziness and giddiness  Unsteadiness on feet  ONSET DATE: 12/15/2022 (referral date)  Rationale for Evaluation and Treatment: Rehabilitation  SUBJECTIVE:   SUBJECTIVE STATEMENT: Patient reports that he hasn't any symptoms of room spinning since he was last here. Denies falls/near falls.  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 No  PRECAUTIONS: Fall  PATIENT GOALS: "Get rid of this vertigo."  VESTIBULAR ASSESSMENT   POSITIONAL TESTING:  Right Dix-Hallpike: upbeating, right nystagmus and mild amplitude on initial check  VESTIBULAR TREATMENT:                                                                                                    Vitals:   01/02/23 0808  BP: 118/68  Pulse: 60   Tested with mat table table in trendelenburg and regular  POSITIONAL TESTING:  Right Dix-Hallpike: no nystagmus noted (rechecked 3x - none noted) Left Dix-Hallpike: no nystagmus noted  Resolution of symptoms and patient denying room spinning at this time  FOTO: 19  PATIENT EDUCATION: Education details: when to return to PT  Person educated: Patient Education method: Explanation and Handouts Education comprehension: verbalized understanding and needs further education  HOME EXERCISE PROGRAM:  Sit to Side-Lying    Sit on edge of bed. 1. Turn head 45 to right. 2. Maintain head position and lie down slowly on left side. Hold until symptoms subside. 3. Sit up slowly. Hold until symptoms subside. 4. Turn head 45 to left. 5. Maintain head position and lie down slowly on right side. Hold until symptoms subside. 6. Sit up slowly. Repeat sequence ___3_ times each side per session. Do _1___ session per day. Do all repetitions to 1 side before doing the other side.   GOALS: Goals reviewed with patient? Yes  LONG TERM GOALS: Target date: 01/23/2023  Patient will report demonstrate independence with final HEP in order to maintain current gains and continue to progress after physical therapy discharge.   Baseline: As indicated, reports understanding of HEP Goal status: MET  2.  Patient will present with negative positional testing to indicate  resolution of BPPV. Baseline: Positive R Dix Hallpike, negative postional testing on 10/4 Goal status: MET  3.  mCTSIB to be assessed / LTG as written as indicated Baseline: To be provided Goal status: DISCONTINUED   4.  Patient will improve FOTO score to 56 to achieve predicted improvements in functional mobility due to skilled physical therapy interventions to increase safety with and participation in daily activities.  Baseline: 39; improved to 70 Goal status: MET  ASSESSMENT:  CLINICAL IMPRESSION: Patient is discharging from PT today due to resolution of a BPPV. Patient's positional testing clear in today's session consistent with subjective report coming in. Patient satisfied with current level of functional agreeable to D/C today.   OBJECTIVE IMPAIRMENTS: decreased balance and dizziness.   ACTIVITY LIMITATIONS: bending and bed mobility  PARTICIPATION LIMITATIONS: community activity and yard work  PERSONAL FACTORS: Age and Time since onset of injury/illness/exacerbation are also affecting patient's functional outcome.   REHAB POTENTIAL: Good  CLINICAL DECISION MAKING: Stable/uncomplicated  EVALUATION COMPLEXITY: Low   PLAN:  PT FREQUENCY: 2x/week  PT DURATION: 5 weeks  PLANNED INTERVENTIONS: Therapeutic exercises, Therapeutic activity, Neuromuscular re-education, Balance training, Gait training, Patient/Family education, Self Care, Vestibular training, Canalith repositioning, Manual therapy, and Re-evaluation  PLAN FOR NEXT SESSION: NA - Patient D/C with updated HEP   Carmelia Bake, PT, DPT  01/02/2023, 8:35 AM

## 2023-01-08 ENCOUNTER — Encounter: Payer: Medicare Other | Admitting: Physical Therapy

## 2023-01-12 ENCOUNTER — Other Ambulatory Visit: Payer: Self-pay | Admitting: Family Medicine

## 2023-01-12 NOTE — Telephone Encounter (Signed)
Prescription Request  01/12/2023  LOV: 12/15/2022  What is the name of the medication or equipment?   tamsulosin (FLOMAX) 0.4 MG CAPS capsule   Have you contacted your pharmacy to request a refill? Yes   Which pharmacy would you like this sent to?  [id number] [place], [place] - [address] [place] [address] Phone: [phone] Fax: [phone]    Patient notified that their request is being sent to the clinical staff for review and that they should receive a response within 2 business days.   Please advise pharmacy

## 2023-01-13 MED ORDER — TAMSULOSIN HCL 0.4 MG PO CAPS: 0.4000 mg | ORAL_CAPSULE | Freq: Every day | ORAL | 0 refills | Status: DC

## 2023-01-13 NOTE — Telephone Encounter (Signed)
Last OV 12/15/22  Requested Prescriptions  Pending Prescriptions Disp Refills   tamsulosin (FLOMAX) 0.4 MG CAPS capsule 90 capsule 0    Sig: Take 1 capsule (0.4 mg total) by mouth daily.     Urology: Alpha-Adrenergic Blocker Failed - 01/12/2023  8:30 AM      Failed - PSA in normal range and within 360 days    PSA  Date Value Ref Range Status  12/25/2020 2.33 < OR = 4.00 ng/mL Final    Comment:    The total PSA value from this assay system is  standardized against the WHO standard. The test  result will be approximately 20% lower when compared  to the equimolar-standardized total PSA (Beckman  Coulter). Comparison of serial PSA results should be  interpreted with this fact in mind. . This test was performed using the Siemens  chemiluminescent method. Values obtained from  different assay methods cannot be used interchangeably. PSA levels, regardless of value, should not be interpreted as absolute evidence of the presence or absence of disease.          Failed - Valid encounter within last 12 months    Recent Outpatient Visits           1 year ago Vertigo   [facility] Tanya Nones, Priscille Heidelberg, MD   1 year ago Chronic right shoulder pain   [facility] Valentino Nose, NP   2 years ago Need for immunization against influenza   [facility] Tanya Nones, Priscille Heidelberg, MD   2 years ago Visit for suture removal   [facility] Donita Brooks, MD   2 years ago Skin lesion of hand   [facility] Pickard, Priscille Heidelberg, MD              Passed - Last BP in normal range    BP Readings from Last 1 Encounters:  01/02/23 118/68

## 2023-02-04 DIAGNOSIS — K080 Exfoliation of teeth due to systemic causes: Secondary | ICD-10-CM | POA: Diagnosis not present

## 2023-02-05 ENCOUNTER — Other Ambulatory Visit: Payer: Self-pay | Admitting: Family Medicine

## 2023-02-05 ENCOUNTER — Telehealth: Payer: Self-pay

## 2023-02-05 MED ORDER — HYDROCODONE BIT-HOMATROP MBR 5-1.5 MG/5ML PO SOLN: 5.0000 mL | Freq: Three times a day (TID) | ORAL | 0 refills | Status: AC | PRN

## 2023-02-05 NOTE — Telephone Encounter (Signed)
Pt called in to speak with nurse to ask if there is something that pcp could call in for his sinus drainage. 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 Pt stated that if pcp would not call in a med w/o pt being seen, he just wanted to speak with nurse to discuss what he could take OTC to help please. Please advise.  Cb#: [phone]

## 2023-02-17 ENCOUNTER — Telehealth: Payer: Self-pay

## 2023-02-17 NOTE — Telephone Encounter (Signed)
Copied from CRM [id number]. Topic: Clinical - Medical Advice >> Feb 17, 2023 12:45 PM Roswell Nickel wrote: Reason for CRM: patient calling request for Dr.Pickard's nurse to call him at  [phone].

## 2023-05-04 ENCOUNTER — Other Ambulatory Visit: Payer: Self-pay | Admitting: Family Medicine

## 2023-05-04 ENCOUNTER — Other Ambulatory Visit: Payer: Self-pay

## 2023-05-04 ENCOUNTER — Telehealth: Payer: Self-pay | Admitting: Family Medicine

## 2023-05-04 DIAGNOSIS — R509 Fever, unspecified: Secondary | ICD-10-CM

## 2023-05-04 MED ORDER — OSELTAMIVIR PHOSPHATE 75 MG PO CAPS: 75.0000 mg | ORAL_CAPSULE | Freq: Every day | ORAL | 0 refills | Status: DC

## 2023-05-04 NOTE — Telephone Encounter (Signed)
Copied from CRM [id number]. Topic: Clinical - Medical Advice >> May 04, 2023  3:50 PM Dennison Nancy wrote: Reason for CRM: Patient is out of town with daughter in law she has the Flu,  Patient was recommended by his daughter law 's provider to get prescribe the tamiflu to prevent patient from getting the flu . Patient currently do not gave any symptoms  Please call patient at [phone]

## 2023-05-04 NOTE — Telephone Encounter (Signed)
Copied from CRM [id number]. Topic: Clinical - Medication Question >> May 04, 2023  2:13 PM Geneva B wrote: Reason for CRM: patient is calling because he wants a rx without being seeing please call [phone]

## 2023-05-29 DIAGNOSIS — G4733 Obstructive sleep apnea (adult) (pediatric): Secondary | ICD-10-CM | POA: Diagnosis not present

## 2023-06-01 NOTE — Patient Instructions (Signed)

## 2023-06-01 NOTE — Progress Notes (Unsigned)
 PATIENT: Jose Owen DOB: [birth year]  REASON FOR VISIT: follow up HISTORY FROM: patient  No chief complaint on file.    HISTORY OF PRESENT ILLNESS:  06/01/23 ALL:  Jose Owen returns for follow up for OSA on CPAP. He reports doing well with therapy. He visits with his dad every 4th night and has forgotten to take his machine with him from time to time. He sleeps fairly well but does continue to wake throughout the night. He is usually rolling over to check time and easily falls back to sleep. He does have some fatigue. He has gained a little weight. He admits to snacking at night.     05/29/2021 ALL: Jose Owen is a 83 y.o. male here today for follow up for OSA on CPAP. He continues to have dry mouth. He wakes multiple times at night  to drink water. He is interested in trying a different mask to see if this Eppes help. His compliance report below shows excellent compliance for days worn and for greater tan 4 hours. His apnea is well managed on a set pressure of 10 cmH20 with an AHI of 1.8.     11/28/2020 ALL:  Jose Owen is a 83 y.o. male here today for follow up for OSA on CPAP.  He has continued to work on improving compliance. He admits that he does not always take his machine with him if he is out of town or visiting his dad. He continues to have dry mouth. He is using a FFM. He is interested in trying a different mask to see if this Levay help. He wakes 2-3 times at night and needs to drink water. He reports that DME adjusted humidity but he has not tried to adjust again at home. Otherwise, he is doing well.   HISTORY: (copied from Dr Teofilo Pod previous note)  Jose Owen is a 83 year old right-handed gentleman with an underlying medical history of low back pain, and mild obesity, who presents for follow-up consultation of his obstructive sleep apnea after interim testing and starting CPAP therapy.  The patient is unaccompanied today.  I first met him at the request of his primary care  physician on 08/04/2019, at which time he reported snoring and excessive daytime somnolence.  He also had a history of low testosterone.  He was advised to proceed with sleep testing.  He had a baseline sleep study followed by a CPAP titration study.  Baseline sleep study from 11/21/2019 showed a sleep efficiency of 82.5%, sleep latency 6 minutes, REM latency 67 minutes.  He had an increased percentage of stage II sleep, and a reduced percentage of REM sleep at 9.5%.  He had 29 obstructive and 13 central apneas, 130 hypopneas for the night.  Total AHI was 30.5/h, REM AHI 39.4/h, supine AHI 32.7/h and O2 nadir 82%.  He was advised to proceed with a full night CPAP titration study, which he had on 12/19/2019. Sleep latency was 8 minutes, REM latency 43 minutes, sleep efficiency 86.4%, he had a normal percentage of stage I and stage II sleep, and increased percentage of slow-wave sleep and a near normal percentage of REM sleep at 17.3%.  He was fitted with a large full facemask after trying a nasal mask.  CPAP was titrated from 5 cm to 10 cm.  On the final pressure his AHI was 0.8/h, with nonsupine REM sleep achieved and O2 nadir of 91%.  Based on the test results I prescribed CPAP therapy for  home use.  Set up date was 03/15/2020.    Today, 05/10/20: I reviewed his CPAP compliance data from 04/09/2020 through 05/12/2020, which is a total of 30 days, during which time he used his machine 27 days with percent use days greater than 4 hours at 57%, indicating mildly suboptimal compliance, average usage of 4 hours and 34 minutes, residual AHI at goal at 1.8/h, leak on the low side with a 95th percentile at 3.7 L/min, pressure of 10 cm with EPR of 3.  In the month of early January through early February 20 04/05/2020 and 05/04/2020 his percent use days greater than 4 hours was slightly better at 60%, still suboptimal.  He reports overall doing fairly well but he does have significant issues with mouth dryness.  He called his DME  provider at least twice, the temperature was first turned down to 74 degrees and then to room air but he still wakes up with severely dry mouth.  He has to sip water in the middle of the night and sometimes he just gets too frustrated and does not put it back on.  He did go to [place] for his grandsons wedding and missed 2 days.  He is willing to continue with treatment and actually has already noticed some benefit and that his nocturia is better and daytime energy are better.  He is motivated to continue with treatment and working on compliance.  He is using a large Simplus full facemask without any problems.  Chinstrap was given to him but it does not fit and he does not actually need it.    The patient's allergies, current medications, family history, past medical history, past social history, past surgical history and problem list were reviewed and updated as appropriate.   REVIEW OF SYSTEMS: Out of a complete 14 system review of symptoms, the patient complains only of the following symptoms, fatigue and all other reviewed systems are negative.  ESS: 10/24, previously 12  ALLERGIES: No Known Allergies  HOME MEDICATIONS: Outpatient Medications Prior to Visit  Medication Sig Dispense Refill   acetaminophen (TYLENOL) 500 MG tablet Take 500 mg by mouth as needed.     HYDROcodone bit-homatropine (HYCODAN) 5-1.5 MG/5ML syrup Take 5 mLs by mouth every 8 (eight) hours as needed for cough. 120 mL 0   meclizine (ANTIVERT) 25 MG tablet Take 1 tablet (25 mg total) by mouth 3 (three) times daily as needed for dizziness. 30 tablet 1   meloxicam (MOBIC) 7.5 MG tablet TAKE 1 TABLET BY MOUTH EVERY DAY AS NEEDED FOR PAIN 30 tablet 0   oseltamivir (TAMIFLU) 75 MG capsule Take 1 capsule (75 mg total) by mouth daily. Dx: Z20.828- exposure to influenza 7 capsule 0   promethazine-dextromethorphan (PROMETHAZINE-DM) 6.25-15 MG/5ML syrup Take 5 mLs by mouth 4 (four) times daily as needed for cough. (Patient not  taking: Reported on 12/15/2022) 118 mL 0   tamsulosin (FLOMAX) 0.4 MG CAPS capsule Take 1 capsule (0.4 mg total) by mouth daily. 90 capsule 0   vitamin B-12 (CYANOCOBALAMIN) 1000 MCG tablet Take 1 tablet (1,000 mcg total) by mouth daily.     No facility-administered medications prior to visit.    PAST MEDICAL HISTORY: Past Medical History:  Diagnosis Date   BPH (benign prostatic hyperplasia)     PAST SURGICAL HISTORY: Past Surgical History:  Procedure Laterality Date   SKIN CANCER EXCISION  10/2020   right hand    FAMILY HISTORY: No family history on file.  SOCIAL HISTORY: Social History  Socioeconomic History   Marital status: Widowed    Spouse name: Not on file   Number of children: 2   Years of education: Not on file   Highest education level: Not on file  Occupational History   Not on file  Tobacco Use   Smoking status: Former    Current packs/day: 0.00    Average packs/day: 0.5 packs/day for 9.0 years (4.5 ttl pk-yrs)    Types: Cigarettes    Start date: 03/31/1962    Quit date: 04/01/1971    Years since quitting: 52.2   Smokeless tobacco: Never  Substance and Sexual Activity   Alcohol use: No   Drug use: No   Sexual activity: Not Currently  Other Topics Concern   Not on file  Social History Narrative   Widowed 03/2018.   Social Drivers of Corporate investment banker Strain: Low Risk  (03/12/2022)   Overall Financial Resource Strain (CARDIA)    Difficulty of Paying Living Expenses: Not hard at all  Food Insecurity: No Food Insecurity (03/12/2022)   Hunger Vital Sign    Worried About Running Out of Food in the Last Year: Never true    Ran Out of Food in the Last Year: Never true  Transportation Needs: No Transportation Needs (03/12/2022)   PRAPARE - Administrator, Civil Service (Medical): No    Lack of Transportation (Non-Medical): No  Physical Activity: Inactive (03/12/2022)   Exercise Vital Sign    Days of Exercise per Week: 0 days     Minutes of Exercise per Session: 0 min  Stress: No Stress Concern Present (03/12/2022)   [employer] of Occupational Health - Occupational Stress Questionnaire    Feeling of Stress : Only a little  Social Connections: Unknown (03/12/2022)   Social Connection and Isolation Panel [NHANES]    Frequency of Communication with Friends and Family: Not on file    Frequency of Social Gatherings with Friends and Family: Not on file    Attends Religious Services: More than 4 times per year    Active Member of [employer] or Organizations: Not on file    Attends Banker Meetings: Not on file    Marital Status: Not on file  Intimate Partner Violence: Not At Risk (03/12/2022)   Humiliation, Afraid, Rape, and Kick questionnaire    Fear of Current or Ex-Partner: No    Emotionally Abused: No    Physically Abused: No    Sexually Abused: No     PHYSICAL EXAM  There were no vitals filed for this visit.    There is no height or weight on file to calculate BMI.  Generalized: Well developed, in no acute distress  Cardiology: normal rate and rhythm, no murmur noted Respiratory: clear to auscultation bilaterally  Neurological examination  Mentation: Alert oriented to time, place, history taking. Follows all commands speech and language fluent Cranial nerve II-XII: Pupils were equal round reactive to light. Extraocular movements were full, visual field were full  Motor: The motor testing reveals 5 over 5 strength of all 4 extremities. Good symmetric motor tone is noted throughout.  Gait and station: Gait is normal.    DIAGNOSTIC DATA (LABS, IMAGING, TESTING) - I reviewed patient records, labs, notes, testing and imaging myself where available.      No data to display           Lab Results  Component Value Date   WBC 5.8 12/08/2022   HGB 14.8 12/08/2022  HCT 43.1 12/08/2022   MCV 91.9 12/08/2022   PLT 235 12/08/2022      Component Value Date/Time   NA 141 12/08/2022 1506    K 4.0 12/08/2022 1506   CL 107 12/08/2022 1506   CO2 24 12/08/2022 1506   GLUCOSE 93 12/08/2022 1506   BUN 16 12/08/2022 1506   CREATININE 0.85 12/08/2022 1506   CALCIUM 9.7 12/08/2022 1506   PROT 6.4 12/08/2022 1506   AST 18 12/08/2022 1506   ALT 16 12/08/2022 1506   BILITOT 0.8 12/08/2022 1506   GFRNONAA 81 07/22/2019 0938   GFRAA 93 07/22/2019 0938   Lab Results  Component Value Date   CHOL 164 07/22/2019   HDL 39 (L) 07/22/2019   LDLCALC 104 (H) 07/22/2019   TRIG 118 07/22/2019   CHOLHDL 4.2 07/22/2019   No results found for: "HGBA1C" Lab Results  Component Value Date   VITAMINB12 645 12/25/2020   Lab Results  Component Value Date   TSH 2.57 12/25/2020     ASSESSMENT AND PLAN 83 y.o. year old male  has a past medical history of BPH (benign prostatic hyperplasia). here with   No diagnosis found.   Kelvis Berger Settlemyre is doing fairly well on CPAP therapy. Compliance report reveals excellent daily and four hour compliance. He was encouraged to continue using CPAP nightly and for greater than 4 hours each night. Risks of untreated sleep apnea review and education materials provided. Healthy lifestyle habits encouraged. He will follow up in 1 year, sooner if needed. He verbalizes understanding and agreement with this plan.    No orders of the defined types were placed in this encounter.     No orders of the defined types were placed in this encounter.     Shawnie Dapper, FNP-C 06/01/2023, 4:41 PM [facility] [address], [address] [place], [place] [address] [phone]

## 2023-06-04 ENCOUNTER — Ambulatory Visit: Payer: Medicare Other | Admitting: Family Medicine

## 2023-06-04 ENCOUNTER — Telehealth: Payer: Self-pay

## 2023-06-04 ENCOUNTER — Encounter: Payer: Self-pay | Admitting: Family Medicine

## 2023-06-04 VITALS — BP 135/67 | HR 75 | Ht 74.0 in | Wt 258.5 lb

## 2023-06-04 DIAGNOSIS — G4733 Obstructive sleep apnea (adult) (pediatric): Secondary | ICD-10-CM | POA: Diagnosis not present

## 2023-06-04 NOTE — Telephone Encounter (Signed)
 Cpap order placed thru community msg in epic:

## 2023-07-03 ENCOUNTER — Other Ambulatory Visit: Payer: Self-pay

## 2023-07-03 MED ORDER — TAMSULOSIN HCL 0.4 MG PO CAPS: 0.4000 mg | ORAL_CAPSULE | Freq: Every day | ORAL | 0 refills | Status: DC

## 2023-07-03 NOTE — Telephone Encounter (Signed)
 Requested Prescriptions  Pending Prescriptions Disp Refills   tamsulosin (FLOMAX) 0.4 MG CAPS capsule 90 capsule 0    Sig: Take 1 capsule (0.4 mg total) by mouth daily.     Urology: Alpha-Adrenergic Blocker Failed - 07/03/2023  4:20 PM      Failed - PSA in normal range and within 360 days    PSA  Date Value Ref Range Status  12/25/2020 2.33 < OR = 4.00 ng/mL Final    Comment:    The total PSA value from this assay system is  standardized against the WHO standard. The test  result will be approximately 20% lower when compared  to the equimolar-standardized total PSA (Beckman  Coulter). Comparison of serial PSA results should be  interpreted with this fact in mind. . This test was performed using the Siemens  chemiluminescent method. Values obtained from  different assay methods cannot be used interchangeably. PSA levels, regardless of value, should not be interpreted as absolute evidence of the presence or absence of disease.          Passed - Last BP in normal range    BP Readings from Last 1 Encounters:  06/04/23 135/67         Passed - Valid encounter within last 12 months    Recent Outpatient Visits           6 months ago Chronic right shoulder pain   [facility] Tanya Nones, Priscille Heidelberg, MD   6 months ago Vertigo   [facility] [place] Meo, FNP   7 months ago Dizziness   [facility] [place] Meo, FNP   1 year ago Bacterial sinusitis   [facility] [facility] [place] Meo, FNP   1 year ago Chronic right shoulder pain   [facility] Pickard, Priscille Heidelberg, MD

## 2023-07-03 NOTE — Telephone Encounter (Signed)
 Prescription Request  07/03/2023  LOV: 12/15/22  What is the name of the medication or equipment? tamsulosin (FLOMAX) 0.4 MG CAPS capsule [[id number]]   Have you contacted your pharmacy to request a refill? Yes   Which pharmacy would you like this sent to?  [id number] [place], [place] - [address] [place] [address] Phone: [phone] Fax: [phone]    Patient notified that their request is being sent to the clinical staff for review and that they should receive a response within 2 business days.   Please advise at [facility] [phone]

## 2023-07-14 ENCOUNTER — Ambulatory Visit: Admitting: Family Medicine

## 2023-07-14 ENCOUNTER — Encounter: Payer: Self-pay | Admitting: Family Medicine

## 2023-07-14 VITALS — BP 126/62 | HR 92 | Temp 98.6°F | Ht 74.0 in | Wt 254.4 lb

## 2023-07-14 VITALS — BP 135/67 | Ht 74.0 in | Wt 258.0 lb

## 2023-07-14 DIAGNOSIS — Z0000 Encounter for general adult medical examination without abnormal findings: Secondary | ICD-10-CM | POA: Diagnosis not present

## 2023-07-14 DIAGNOSIS — R5383 Other fatigue: Secondary | ICD-10-CM | POA: Diagnosis not present

## 2023-07-14 DIAGNOSIS — K219 Gastro-esophageal reflux disease without esophagitis: Secondary | ICD-10-CM

## 2023-07-14 MED ORDER — PANTOPRAZOLE SODIUM 40 MG PO TBEC: 40.0000 mg | DELAYED_RELEASE_TABLET | Freq: Every day | ORAL | 3 refills | Status: DC

## 2023-07-14 NOTE — Patient Instructions (Signed)
 Health Maintenance, Male  Adopting a healthy lifestyle and getting preventive care are important in promoting health and wellness. Ask your health care provider about:  The right schedule for you to have regular tests and exams.  Things you can do on your own to prevent diseases and keep yourself healthy.  What should I know about diet, weight, and exercise?  Eat a healthy diet    Eat a diet that includes plenty of vegetables, fruits, low-fat dairy products, and lean protein.  Do not eat a lot of foods that are high in solid fats, added sugars, or sodium.  Maintain a healthy weight  Body mass index (BMI) is a measurement that can be used to identify possible weight problems. It estimates body fat based on height and weight. Your health care provider can help determine your BMI and help you achieve or maintain a healthy weight.  Get regular exercise  Get regular exercise. This is one of the most important things you can do for your health. Most adults should:  Exercise for at least 150 minutes each week. The exercise should increase your heart rate and make you sweat (moderate-intensity exercise).  Do strengthening exercises at least twice a week. This is in addition to the moderate-intensity exercise.  Spend less time sitting. Even light physical activity can be beneficial.  Watch cholesterol and blood lipids  Have your blood tested for lipids and cholesterol at 81 years of age, then have this test every 5 years.  You may need to have your cholesterol levels checked more often if:  Your lipid or cholesterol levels are high.  You are older than 81 years of age.  You are at high risk for heart disease.  What should I know about cancer screening?  Many types of cancers can be detected early and may often be prevented. Depending on your health history and family history, you may need to have cancer screening at various ages. This may include screening for:  Colorectal cancer.  Prostate cancer.  Skin cancer.  Lung  cancer.  What should I know about heart disease, diabetes, and high blood pressure?  Blood pressure and heart disease  High blood pressure causes heart disease and increases the risk of stroke. This is more likely to develop in people who have high blood pressure readings or are overweight.  Talk with your health care provider about your target blood pressure readings.  Have your blood pressure checked:  Every 3-5 years if you are 81-95 years of age.  Every year if you are 81 years old or older.  If you are between the ages of 29 and 29 and are a current or former smoker, ask your health care provider if you should have a one-time screening for abdominal aortic aneurysm (AAA).  Diabetes  Have regular diabetes screenings. This checks your fasting blood sugar level. Have the screening done:  Once every three years after age 23 if you are at a normal weight and have a low risk for diabetes.  More often and at a younger age if you are overweight or have a high risk for diabetes.  What should I know about preventing infection?  Hepatitis B  If you have a higher risk for hepatitis B, you should be screened for this virus. Talk with your health care provider to find out if you are at risk for hepatitis B infection.  Hepatitis C  Blood testing is recommended for:  Everyone born from [birth year] through 1963.  Anyone  with known risk factors for hepatitis C.  Sexually transmitted infections (STIs)  You should be screened each year for STIs, including gonorrhea and chlamydia, if:  You are sexually active and are younger than 81 years of age.  You are older than 81 years of age and your health care provider tells you that you are at risk for this type of infection.  Your sexual activity has changed since you were last screened, and you are at increased risk for chlamydia or gonorrhea. Ask your health care provider if you are at risk.  Ask your health care provider about whether you are at high risk for HIV. Your health care provider  may recommend a prescription medicine to help prevent HIV infection. If you choose to take medicine to prevent HIV, you should first get tested for HIV. You should then be tested every 3 months for as long as you are taking the medicine.  Follow these instructions at home:  Alcohol use  Do not drink alcohol if your health care provider tells you not to drink.  If you drink alcohol:  Limit how much you have to 0-2 drinks a day.  Know how much alcohol is in your drink. In the [place] one drink equals one 12 oz bottle of beer (355 mL), one 5 oz glass of wine (148 mL), or one 1 oz glass of hard liquor (44 mL).  Lifestyle  Do not use any products that contain nicotine or tobacco. These products include cigarettes, chewing tobacco, and vaping devices, such as e-cigarettes. If you need help quitting, ask your health care provider.  Do not use street drugs.  Do not share needles.  Ask your health care provider for help if you need support or information about quitting drugs.  General instructions  Schedule regular health, dental, and eye exams.  Stay current with your vaccines.  Tell your health care provider if:  You often feel depressed.  You have ever been abused or do not feel safe at home.  Summary  Adopting a healthy lifestyle and getting preventive care are important in promoting health and wellness.  Follow your health care provider's instructions about healthy diet, exercising, and getting tested or screened for diseases.  Follow your health care provider's instructions on monitoring your cholesterol and blood pressure.  This information is not intended to replace advice given to you by your health care provider. Make sure you discuss any questions you have with your health care provider.  Document Revised: 08/06/2020 Document Reviewed: 08/06/2020  Elsevier Patient Education  2024 [employer].

## 2023-07-14 NOTE — Progress Notes (Signed)
 Subjective:    Patient ID: Jose Owen, male    DOB: 12-28-1940, 83 y.o.   MRN: [id number]  GI Problem  Erectile Dysfunction   Patient states for the last month or so, he has been getting a pressure-like sensation in his epigastric and substernal area whenever he sits around.  He burped, the relief pressure feels better.  He does report more indigestion.  He denies any melena or hematochezia.  He denies any chest pain.  He denies any pressure with exertion or shortness of breath with activity.  This primarily occurs when he is sitting down.  He denies any dysphagia or odynophagia.  He denies any fevers or chills or weight loss.  He denies any pain with eating.  He does report fatigue.  He states that he is tired most days.  He does have a history of sleep apnea but he is consistently wearing his CPAP machine.  He also reports some erectile dysfunction that does not improve despite trying Cialis or Viagra Past Medical History:  Diagnosis Date   BPH (benign prostatic hyperplasia)    Past Surgical History:  Procedure Laterality Date   SKIN CANCER EXCISION  10/2020   right hand   Current Outpatient Medications on File Prior to Visit  Medication Sig Dispense Refill   acetaminophen (TYLENOL) 500 MG tablet Take 500 mg by mouth as needed.     HYDROcodone bit-homatropine (HYCODAN) 5-1.5 MG/5ML syrup Take 5 mLs by mouth every 8 (eight) hours as needed for cough. 120 mL 0   meclizine (ANTIVERT) 25 MG tablet Take 1 tablet (25 mg total) by mouth 3 (three) times daily as needed for dizziness. 30 tablet 1   meloxicam (MOBIC) 7.5 MG tablet TAKE 1 TABLET BY MOUTH EVERY DAY AS NEEDED FOR PAIN 30 tablet 0   tamsulosin (FLOMAX) 0.4 MG CAPS capsule Take 1 capsule (0.4 mg total) by mouth daily. 90 capsule 0   vitamin B-12 (CYANOCOBALAMIN) 1000 MCG tablet Take 1 tablet (1,000 mcg total) by mouth daily.     No current facility-administered medications on file prior to visit.   No Known Allergies Social  History   Socioeconomic History   Marital status: Widowed    Spouse name: Not on file   Number of children: 2   Years of education: Not on file   Highest education level: Not on file  Occupational History   Not on file  Tobacco Use   Smoking status: Former    Current packs/day: 0.00    Average packs/day: 0.5 packs/day for 9.0 years (4.5 ttl pk-yrs)    Types: Cigarettes    Start date: 03/31/1962    Quit date: 04/01/1971    Years since quitting: 52.3   Smokeless tobacco: Never  Substance and Sexual Activity   Alcohol use: No   Drug use: No   Sexual activity: Not Currently  Other Topics Concern   Not on file  Social History Narrative   Widowed 03/2018.   Social Drivers of Corporate investment banker Strain: Low Risk  (07/14/2023)   Overall Financial Resource Strain (CARDIA)    Difficulty of Paying Living Expenses: Not hard at all  Food Insecurity: No Food Insecurity (07/14/2023)   Hunger Vital Sign    Worried About Running Out of Food in the Last Year: Never true    Ran Out of Food in the Last Year: Never true  Transportation Needs: No Transportation Needs (07/14/2023)   PRAPARE - Transportation    Lack of  Transportation (Medical): No    Lack of Transportation (Non-Medical): No  Physical Activity: Sufficiently Active (07/14/2023)   Exercise Vital Sign    Days of Exercise per Week: 5 days    Minutes of Exercise per Session: 30 min  Stress: No Stress Concern Present (07/14/2023)   [employer] of Occupational Health - Occupational Stress Questionnaire    Feeling of Stress : Not at all  Social Connections: Moderately Integrated (07/14/2023)   Social Connection and Isolation Panel [NHANES]    Frequency of Communication with Friends and Family: More than three times a week    Frequency of Social Gatherings with Friends and Family: Twice a week    Attends Religious Services: More than 4 times per year    Active Member of [employer] or Organizations: Yes    Attends Tax inspector Meetings: More than 4 times per year    Marital Status: Widowed  Intimate Partner Violence: Not At Risk (07/14/2023)   Humiliation, Afraid, Rape, and Kick questionnaire    Fear of Current or Ex-Partner: No    Emotionally Abused: No    Physically Abused: No    Sexually Abused: No   No family history on file. Father has a history of atrial fibrillation and prostate cancer.  Mother died due to old age.  He does have a family history of familial lipomatosis  Review of Systems  All other systems reviewed and are negative.      Objective:   Physical Exam Vitals reviewed.  Constitutional:      General: He is not in acute distress.    Appearance: Normal appearance. He is obese. He is not ill-appearing, toxic-appearing or diaphoretic.  HENT:     Head: Normocephalic and atraumatic.     Mouth/Throat:     Mouth: Mucous membranes are moist.     Pharynx: Uvula swelling present. No pharyngeal swelling or oropharyngeal exudate.   Eyes:     General: No scleral icterus.       Right eye: No discharge.        Left eye: No discharge.     Extraocular Movements: Extraocular movements intact.     Conjunctiva/sclera: Conjunctivae normal.     Pupils: Pupils are equal, round, and reactive to light.  Neck:     Vascular: No carotid bruit.  Cardiovascular:     Rate and Rhythm: Normal rate and regular rhythm.     Pulses: Normal pulses.     Heart sounds: Normal heart sounds. No murmur heard.    No friction rub. No gallop.  Pulmonary:     Effort: Pulmonary effort is normal. No respiratory distress.     Breath sounds: Normal breath sounds. No stridor. No wheezing, rhonchi or rales.  Chest:     Chest wall: No tenderness.  Abdominal:     General: Abdomen is flat. Bowel sounds are normal. There is no distension.     Palpations: Abdomen is soft. There is no mass.     Tenderness: There is no abdominal tenderness. There is no right CVA tenderness, left CVA tenderness, guarding or rebound.      Hernia: No hernia is present.  Musculoskeletal:        General: No swelling, tenderness, deformity or signs of injury.     Cervical back: Normal range of motion and neck supple. No rigidity or tenderness.     Right lower leg: No edema.     Left lower leg: No edema.  Lymphadenopathy:     Cervical:  No cervical adenopathy.  Skin:    General: Skin is warm.     Capillary Refill: Capillary refill takes less than 2 seconds.     Coloration: Skin is not jaundiced or pale.     Findings: No bruising, erythema, lesion or rash.  Neurological:     General: No focal deficit present.     Mental Status: He is alert and oriented to person, place, and time. Mental status is at baseline.     Cranial Nerves: No cranial nerve deficit.     Sensory: No sensory deficit.     Motor: No weakness.     Coordination: Coordination normal.     Gait: Gait normal.     Deep Tendon Reflexes: Reflexes normal.  Psychiatric:        Mood and Affect: Mood normal.        Behavior: Behavior normal.        Thought Content: Thought content normal.        Judgment: Judgment normal.           Assessment & Plan:  Fatigue, unspecified type - Plan: CBC with Differential/Platelet, COMPLETE METABOLIC PANEL WITHOUT GFR, TSH, Vitamin B12, Testosterone Total,Free,Bio, Males  Gastroesophageal reflux disease, unspecified whether esophagitis present I believe the patient is dealing with acid reflux/GERD.  Begin Protonix 40 mg a day and reassess in 2 weeks to see if symptoms are improving.  Patient is not currently taking any NSAID.  His medication list is incorrect.  The only medicine he is taking right now is tamsulosin.  Given his fatigue I will check a CBC, CMP, a TSH, B12, and a testosterone level.  We discussed intracavernosal injection therapy versus a vacuum device, but the patient is not interested in these 2 options

## 2023-07-14 NOTE — Progress Notes (Signed)
 Subjective:   Jose Owen is a 83 y.o. male who presents for Medicare Annual/Subsequent preventive examination.  Visit Complete: Virtual I connected with  Jose Owen on 07/14/23 by a audio enabled telemedicine application and verified that I am speaking with the correct person using two identifiers.  Patient Location: Home  Provider Location: Home Office  I discussed the limitations of evaluation and management by telemedicine. The patient expressed understanding and agreed to proceed.  Vital Signs: Because this visit was a virtual/telehealth visit, some criteria Kumpf be missing or patient reported. Any vitals not documented were not able to be obtained and vitals that have been documented are patient reported.  Patient Medicare AWV questionnaire was completed by the patient on 07/14/2023; I have confirmed that all information answered by patient is correct and no changes since this date.  Cardiac Risk Factors include: advanced age (>11men, >86 women);male gender     Objective:    Today's Vitals   07/14/23 1013  BP: 135/67  Weight: 258 lb (117 kg)  Height: 6\' 2"  (1.88 m)   Body mass index is 33.13 kg/m.     07/14/2023   10:43 AM 12/19/2022    8:47 AM 03/12/2022    8:35 AM  Advanced Directives  Does Patient Have a Medical Advance Directive? Yes No Yes  Type of Advance Directive Living will  Healthcare Power of [place];Living will  Does patient want to make changes to medical advance directive? No - Patient declined    Copy of Healthcare Power of Attorney in Chart?   No - copy requested  Would patient like information on creating a medical advance directive?  No - Patient declined     Current Medications (verified) Outpatient Encounter Medications as of 07/14/2023  Medication Sig   acetaminophen (TYLENOL) 500 MG tablet Take 500 mg by mouth as needed.   HYDROcodone bit-homatropine (HYCODAN) 5-1.5 MG/5ML syrup Take 5 mLs by mouth every 8 (eight) hours as needed for  cough.   meclizine (ANTIVERT) 25 MG tablet Take 1 tablet (25 mg total) by mouth 3 (three) times daily as needed for dizziness.   meloxicam (MOBIC) 7.5 MG tablet TAKE 1 TABLET BY MOUTH EVERY DAY AS NEEDED FOR PAIN   tamsulosin (FLOMAX) 0.4 MG CAPS capsule Take 1 capsule (0.4 mg total) by mouth daily.   vitamin B-12 (CYANOCOBALAMIN) 1000 MCG tablet Take 1 tablet (1,000 mcg total) by mouth daily.   No facility-administered encounter medications on file as of 07/14/2023.    Allergies (verified) Patient has no known allergies.   History: Past Medical History:  Diagnosis Date   BPH (benign prostatic hyperplasia)    Past Surgical History:  Procedure Laterality Date   SKIN CANCER EXCISION  10/2020   right hand   History reviewed. No pertinent family history. Social History   Socioeconomic History   Marital status: Widowed    Spouse name: Not on file   Number of children: 2   Years of education: Not on file   Highest education level: Not on file  Occupational History   Not on file  Tobacco Use   Smoking status: Former    Current packs/day: 0.00    Average packs/day: 0.5 packs/day for 9.0 years (4.5 ttl pk-yrs)    Types: Cigarettes    Start date: 03/31/1962    Quit date: 04/01/1971    Years since quitting: 52.3   Smokeless tobacco: Never  Substance and Sexual Activity   Alcohol use: No   Drug use:  No   Sexual activity: Not Currently  Other Topics Concern   Not on file  Social History Narrative   Widowed 03/2018.   Social Drivers of Corporate investment banker Strain: Low Risk  (07/14/2023)   Overall Financial Resource Strain (CARDIA)    Difficulty of Paying Living Expenses: Not hard at all  Food Insecurity: No Food Insecurity (07/14/2023)   Hunger Vital Sign    Worried About Running Out of Food in the Last Year: Never true    Ran Out of Food in the Last Year: Never true  Transportation Needs: No Transportation Needs (07/14/2023)   PRAPARE - Scientist, research (physical sciences) (Medical): No    Lack of Transportation (Non-Medical): No  Physical Activity: Sufficiently Active (07/14/2023)   Exercise Vital Sign    Days of Exercise per Week: 5 days    Minutes of Exercise per Session: 30 min  Stress: No Stress Concern Present (07/14/2023)   [employer] of Occupational Health - Occupational Stress Questionnaire    Feeling of Stress : Not at all  Social Connections: Moderately Integrated (07/14/2023)   Social Connection and Isolation Panel [NHANES]    Frequency of Communication with Friends and Family: More than three times a week    Frequency of Social Gatherings with Friends and Family: Twice a week    Attends Religious Services: More than 4 times per year    Active Member of [employer] or Organizations: Yes    Attends Banker Meetings: More than 4 times per year    Marital Status: Widowed    Tobacco Counseling Counseling given: Not Answered   Clinical Intake:  Pre-visit preparation completed: Yes  Pain : No/denies pain     Nutritional Risks: None Diabetes: No  How often do you need to have someone help you when you read instructions, pamphlets, or other written materials from your doctor or pharmacy?: 1 - Never What is the last grade level you completed in school?: 12th grade  Interpreter Needed?: No      Activities of Daily Living    07/14/2023   10:37 AM  In your present state of health, do you have any difficulty performing the following activities:  Hearing? 1  Vision? 1  Difficulty concentrating or making decisions? 0  Walking or climbing stairs? 0  Dressing or bathing? 0  Doing errands, shopping? 0  Preparing Food and eating ? N  Using the Toilet? N  In the past six months, have you accidently leaked urine? N  Do you have problems with loss of bowel control? N  Managing your Medications? N  Managing your Finances? N  Housekeeping or managing your Housekeeping? N    Patient Care Team: Austine Lefort, MD as PCP - General (Family Medicine)  Indicate any recent Medical Services you Passon have received from other than [facility] providers in the past year (date Rill be approximate).     Assessment:   This is a routine wellness examination for Jose Owen.  Hearing/Vision screen No results found.   Goals Addressed             This Visit's Progress    Activity and Exercise Increased       Evidence-based guidance:  Review current exercise levels.  Assess patient perspective on exercise or activity level, barriers to increasing activity, motivation and readiness for change.  Recommend or set healthy exercise goal based on individual tolerance.  Encourage small steps toward making change in amount  of exercise or activity.  Urge reduction of sedentary activities or screen time.  Promote group activities within the community or with family or support person.  Consider referral to rehabiliation therapist for assessment and exercise/activity plan.   Notes:       Depression Screen    07/14/2023   10:42 AM 03/12/2022    8:38 AM 03/12/2022    8:31 AM 02/11/2022    2:19 PM 12/21/2020    2:19 PM 07/22/2019    9:04 AM 03/05/2017   11:29 AM  PHQ 2/9 Scores  PHQ - 2 Score 0 0 0 0 0 0 0    Fall Risk    07/14/2023   10:43 AM 03/12/2022    8:36 AM 03/12/2022    8:12 AM 02/11/2022    2:19 PM 08/30/2020    2:06 PM  Fall Risk   Falls in the past year? 1 1 1  0 0  Number falls in past yr: 0 0 0 0 0  Injury with Fall? 0 0 0 0 0  Risk for fall due to : No Fall Risks History of fall(s)  No Fall Risks   Follow up Falls evaluation completed Falls prevention discussed  Falls prevention discussed     MEDICARE RISK AT HOME: Medicare Risk at Home Any stairs in or around the home?: Yes If so, are there any without handrails?: No Home free of loose throw rugs in walkways, pet beds, electrical cords, etc?: Yes Adequate lighting in your home to reduce risk of falls?: Yes Life alert?: No Use of a cane,  walker or w/c?: No Grab bars in the bathroom?: Yes Shower chair or bench in shower?: Yes Elevated toilet seat or a handicapped toilet?: Yes  TIMED UP AND GO:  Was the test performed?  No    Cognitive Function:        07/14/2023   10:44 AM 03/12/2022    8:37 AM  6CIT Screen  What Year? 0 points 0 points  What month? 0 points 0 points  What time? 0 points 0 points  Count back from 20 0 points 0 points  Months in reverse 0 points 0 points  Repeat phrase 6 points 0 points  Total Score 6 points 0 points    Immunizations Immunization History  Administered Date(s) Administered   Fluad Quad(high Dose 65+) 12/25/2020, 01/10/2022   Influenza,inj,Quad PF,6+ Mos 03/03/2013   PFIZER(Purple Top)SARS-COV-2 Vaccination 04/22/2019, 05/23/2019   Pneumococcal Conjugate-13 03/03/2013   Pneumococcal Polysaccharide-23 07/22/2019   Tdap 11/24/2022    Flu Vaccine status: Up to date  Pneumococcal vaccine status: Up to date  Covid-19 vaccine status: Information provided on how to obtain vaccines.   Qualifies for Shingles Vaccine? Yes   Zostavax completed No   Shingrix Completed?: No.    Education has been provided regarding the importance of this vaccine. Patient has been advised to call insurance company to determine out of pocket expense if they have not yet received this vaccine. Advised Shukla also receive vaccine at local pharmacy or Health Dept. Verbalized acceptance and understanding.  Screening Tests Health Maintenance  Topic Date Due   Zoster Vaccines- Shingrix (1 of 2) Never done   COVID-19 Vaccine (3 - Pfizer risk series) 06/20/2019   INFLUENZA VACCINE  10/30/2023   Medicare Annual Wellness (AWV)  07/13/2024   Pneumonia Vaccine 65+ Years old  Completed   HPV VACCINES  Aged Out   Meningococcal B Vaccine  Aged Out   DTaP/Tdap/Td  Discontinued   Colonoscopy  Discontinued    Health Maintenance  Health Maintenance Due  Topic Date Due   Zoster Vaccines- Shingrix (1 of 2) Never  done   COVID-19 Vaccine (3 - Pfizer risk series) 06/20/2019    Colorectal cancer screening: No longer required.   Lung Cancer Screening: (Low Dose CT Chest recommended if Age 54-80 years, 20 pack-year currently smoking OR have quit w/in 15years.) does not qualify.   Lung Cancer Screening Referral:   Additional Screening:  Hepatitis C Screening: does not qualify; Completed   Vision Screening: Recommended annual ophthalmology exams for early detection of glaucoma and other disorders of the eye. Is the patient up to date with their annual eye exam?  Yes  Who is the provider or what is the name of the office in which the patient attends annual eye exams? Dr Annabell Key If pt is not established with a provider, would they like to be referred to a provider to establish care? No .   Dental Screening: Recommended annual dental exams for proper oral hygiene  Diabetic Foot Exam:   Community Resource Referral / Chronic Care Management: CRR required this visit?  No   CCM required this visit?  No     Plan:     I have personally reviewed and noted the following in the patient's chart:   Medical and social history Use of alcohol, tobacco or illicit drugs  Current medications and supplements including opioid prescriptions. Patient is not currently taking opioid prescriptions. Functional ability and status Nutritional status Physical activity Advanced directives List of other physicians Hospitalizations, surgeries, and ER visits in previous 12 months Vitals Screenings to include cognitive, depression, and falls Referrals and appointments  In addition, I have reviewed and discussed with patient certain preventive protocols, quality metrics, and best practice recommendations. A written personalized care plan for preventive services as well as general preventive health recommendations were provided to patient.     Alvina Axon   07/14/2023   After Visit Summary: (MyChart) Due to this  being a telephonic visit, the after visit summary with patients personalized plan was offered to patient via MyChart   Nurse Notes:    Mr. Axford , Thank you for taking time to come for your Medicare Wellness Visit. I appreciate your ongoing commitment to your health goals. Please review the following plan we discussed and let me know if I can assist you in the future.   These are the goals we discussed:  Goals      Activity and Exercise Increased     Evidence-based guidance:  Review current exercise levels.  Assess patient perspective on exercise or activity level, barriers to increasing activity, motivation and readiness for change.  Recommend or set healthy exercise goal based on individual tolerance.  Encourage small steps toward making change in amount of exercise or activity.  Urge reduction of sedentary activities or screen time.  Promote group activities within the community or with family or support person.  Consider referral to rehabiliation therapist for assessment and exercise/activity plan.   Notes:      Exercise 3x per week (30 min per time)     Have 3 meals a day     Pt states he would like to eat a better diet.  Pt would like to travel more.        This is a list of the screening recommended for you and due dates:  Health Maintenance  Topic Date Due   Zoster (Shingles) Vaccine (1 of 2) Never done  COVID-19 Vaccine (3 - Pfizer risk series) 06/20/2019   Flu Shot  10/30/2023   Medicare Annual Wellness Visit  07/13/2024   Pneumonia Vaccine  Completed   HPV Vaccine  Aged Out   Meningitis B Vaccine  Aged Out   DTaP/Tdap/Td vaccine  Discontinued   Colon Cancer Screening  Discontinued

## 2023-07-15 LAB — COMPLETE METABOLIC PANEL WITHOUT GFR
AG Ratio: 1.9 (calc) (ref 1.0–2.5)
ALT: 13 U/L (ref 9–46)
AST: 18 U/L (ref 10–35)
Albumin: 4.2 g/dL (ref 3.6–5.1)
Alkaline phosphatase (APISO): 70 U/L (ref 35–144)
BUN: 16 mg/dL (ref 7–25)
CO2: 23 mmol/L (ref 20–32)
Calcium: 9.2 mg/dL (ref 8.6–10.3)
Chloride: 107 mmol/L (ref 98–110)
Creat: 1.13 mg/dL (ref 0.70–1.22)
Globulin: 2.2 g/dL (ref 1.9–3.7)
Glucose, Bld: 137 mg/dL — ABNORMAL HIGH (ref 65–99)
Potassium: 3.9 mmol/L (ref 3.5–5.3)
Sodium: 140 mmol/L (ref 135–146)
Total Bilirubin: 0.8 mg/dL (ref 0.2–1.2)
Total Protein: 6.4 g/dL (ref 6.1–8.1)

## 2023-07-15 LAB — TSH: TSH: 2 m[IU]/L (ref 0.40–4.50)

## 2023-07-15 LAB — CBC WITH DIFFERENTIAL/PLATELET
Absolute Lymphocytes: 1505 {cells}/uL (ref 850–3900)
Absolute Monocytes: 313 {cells}/uL (ref 200–950)
Basophils Absolute: 53 {cells}/uL (ref 0–200)
Basophils Relative: 1 %
Eosinophils Absolute: 80 {cells}/uL (ref 15–500)
Eosinophils Relative: 1.5 %
HCT: 42 % (ref 38.5–50.0)
Hemoglobin: 14.2 g/dL (ref 13.2–17.1)
MCH: 31.2 pg (ref 27.0–33.0)
MCHC: 33.8 g/dL (ref 32.0–36.0)
MCV: 92.3 fL (ref 80.0–100.0)
MPV: 11.1 fL (ref 7.5–12.5)
Monocytes Relative: 5.9 %
Neutro Abs: 3350 {cells}/uL (ref 1500–7800)
Neutrophils Relative %: 63.2 %
Platelets: 227 10*3/uL (ref 140–400)
RBC: 4.55 10*6/uL (ref 4.20–5.80)
RDW: 12.8 % (ref 11.0–15.0)
Total Lymphocyte: 28.4 %
WBC: 5.3 10*3/uL (ref 3.8–10.8)

## 2023-07-15 LAB — TESTOSTERONE TOTAL,FREE,BIO, MALES
Sex Hormone Binding: 29 nmol/L (ref 22–77)
Sex Hormone Binding: 29 nmol/L (ref 3.6–77)
Testosterone, Bioavailable: 93.7 ng/dL (ref 15.0–150.0)
Testosterone, Free: 48.6 pg/mL (ref 6.0–73.0)
Testosterone: 335 ng/dL (ref 250–827)

## 2023-07-15 LAB — VITAMIN B12: Vitamin B-12: 937 pg/mL (ref 200–1100)

## 2023-07-27 ENCOUNTER — Encounter: Payer: Self-pay | Admitting: Family Medicine

## 2023-07-27 ENCOUNTER — Ambulatory Visit: Payer: Self-pay

## 2023-07-27 DIAGNOSIS — G4733 Obstructive sleep apnea (adult) (pediatric): Secondary | ICD-10-CM | POA: Diagnosis not present

## 2023-07-27 NOTE — Telephone Encounter (Signed)
 CRM #[id number] Reason for CRM: Patient called. States he has a bad cold and throat is starting to hurt and he wanted to know if provider would send something in to [facility].  Chief Complaint: Productive cough Symptoms: Congestion, sore throat, chills Frequency: Since weekend Pertinent Negatives: Patient denies difficulty breathing Disposition: [] ED /[] Urgent Care (no appt availability in office) / [x] Appointment(In office/virtual)/ []  [facility]/ [] Home Care/ [] Refused Recommended Disposition /[] [facility]/ []  Follow-up with PCP Additional Notes: Patient called in to report a productive cough, nasal congestion, sore throat and chills that have been present since this weekend. Patient stated he is having frequent coughing spells. Patient denied chest pain and difficulty breathing. Advised patient to be seen within 24 hours, per protocol. No availability with PCP. Scheduled with alternate provider in office. Provided care advice and instructed patient to call back if symptoms worsen. Patient complied.   Reason for Disposition  [1] Continuous (nonstop) coughing interferes with work or school AND [2] no improvement using cough treatment per Care Advice  Answer Assessment - Initial Assessment Questions 1. ONSET: "When did the cough begin?"      Since this weekend 2. SEVERITY: "How bad is the cough today?"      Frequent spells  3. SPUTUM: "Describe the color of your sputum" (none, dry cough; clear, white, yellow, green)     Unknown, states he swallows it and does not cough it up 5. DIFFICULTY BREATHING: "Are you having difficulty breathing?" If Yes, ask: "How bad is it?" (e.g., mild, moderate, severe)    - MILD: No SOB at rest, mild SOB with walking, speaks normally in sentences, can lie down, no retractions, pulse < 100.    - MODERATE: SOB at rest, SOB with minimal exertion and prefers to sit, cannot lie down flat, speaks in phrases, mild retractions, audible wheezing, pulse  100-120.    - SEVERE: Very SOB at rest, speaks in single words, struggling to breathe, sitting hunched forward, retractions, pulse > 120      Denies 6. FEVER: "Do you have a fever?" If Yes, ask: "What is your temperature, how was it measured, and when did it start?"     Unknown, states he had chills yesterday  7. CARDIAC HISTORY: "Do you have any history of heart disease?" (e.g., heart attack, congestive heart failure)      Denies 8. LUNG HISTORY: "Do you have any history of lung disease?"  (e.g., pulmonary embolus, asthma, emphysema)     Denies 10. OTHER SYMPTOMS: "Do you have any other symptoms?" (e.g., runny nose, wheezing, chest pain)     Sore throat, clear nasal discharge, denies wheezing, denies chest pain  Protocols used: Cough - Acute Productive-A-AH

## 2023-07-27 NOTE — Telephone Encounter (Signed)
 Copied from CRM [id number]. Topic: Clinical - Medical Advice >> Jul 27, 2023 11:04 AM Crispin Dolphin wrote: Reason for CRM: Patient called. States he has a bad cold and throat is starting to hurt and he wanted to know if provider would [facility]. Thank You This encounter was created in error - please disregard.

## 2023-07-28 ENCOUNTER — Encounter: Payer: Self-pay | Admitting: Family Medicine

## 2023-07-28 ENCOUNTER — Ambulatory Visit (INDEPENDENT_AMBULATORY_CARE_PROVIDER_SITE_OTHER): Admitting: Family Medicine

## 2023-07-28 VITALS — BP 125/70 | HR 81 | Temp 98.7°F | Ht 74.0 in | Wt 256.0 lb

## 2023-07-28 DIAGNOSIS — J069 Acute upper respiratory infection, unspecified: Secondary | ICD-10-CM | POA: Insufficient documentation

## 2023-07-28 NOTE — Patient Instructions (Signed)
 Your symptoms and exam findings are most consistent with a viral upper respiratory infection. These usually run their course in 5-7 days. Unfortunately, antibiotics don't work against viruses and just increase your risk of other issues such as diarrhea, yeast infections, and resistant infections.  If your symptoms last longer than 10 days and/or you start feeling worse with facial pain, high fever, cough, shortness of breath or start feeling significantly worse, please call us right away to be further evaluated.  Some things that can make you feel better are: - Increased rest - Increasing fluid with water/sugar free electrolytes - Acetaminophen as needed for fever/pain.  - Salt water gargling, chloraseptic spray and throat lozenges - OTC guaifenesin (Mucinex).  - Saline sinus flushes or a neti pot.  - Humidifying the air.

## 2023-07-28 NOTE — Progress Notes (Signed)
 Subjective:  HPI: Jose Owen is a 83 y.o. male presenting on 07/28/2023 for Cough (Symptoms x four days-started feeling bad in [place]  ), Chills, and Nasal Congestion   Cough   Patient is in today for 4 days productive cough, nasal congestion, chills,  Denies rhinorrhea, sore throat, fever, SOB, wheezing, sinus pain, pleurisy, N/V/D, seasonal allergies No known sick exposure or covid test Has tried Hycodan, cough drops, flonase Overall improving  Review of Systems  Respiratory:  Positive for cough.   All other systems reviewed and are negative.   Relevant past medical history reviewed and updated as indicated.   Past Medical History:  Diagnosis Date   BPH (benign prostatic hyperplasia)      Past Surgical History:  Procedure Laterality Date   SKIN CANCER EXCISION  10/2020   right hand    Allergies and medications reviewed and updated.   Current Outpatient Medications:    acetaminophen  (TYLENOL ) 500 MG tablet, Take 500 mg by mouth as needed., Disp: , Rfl:    tamsulosin  (FLOMAX ) 0.4 MG CAPS capsule, Take 1 capsule (0.4 mg total) by mouth daily., Disp: 90 capsule, Rfl: 0   HYDROcodone  bit-homatropine (HYCODAN) 5-1.5 MG/5ML syrup, Take 5 mLs by mouth every 8 (eight) hours as needed for cough. (Patient not taking: Reported on 07/28/2023), Disp: 120 mL, Rfl: 0   meclizine  (ANTIVERT ) 25 MG tablet, Take 1 tablet (25 mg total) by mouth 3 (three) times daily as needed for dizziness. (Patient not taking: Reported on 07/28/2023), Disp: 30 tablet, Rfl: 1   meloxicam  (MOBIC ) 7.5 MG tablet, TAKE 1 TABLET BY MOUTH EVERY DAY AS NEEDED FOR PAIN (Patient not taking: Reported on 07/28/2023), Disp: 30 tablet, Rfl: 0   pantoprazole  (PROTONIX ) 40 MG tablet, Take 1 tablet (40 mg total) by mouth daily., Disp: 30 tablet, Rfl: 3   vitamin B-12 (CYANOCOBALAMIN ) 1000 MCG tablet, Take 1 tablet (1,000 mcg total) by mouth daily. (Patient not taking: Reported on 07/28/2023), Disp: , Rfl:   No Known  Allergies  Objective:   BP 125/70   Pulse 81   Temp 98.7 F (37.1 C)   Ht 6\' 2"  (1.88 m)   Wt 256 lb (116.1 kg)   SpO2 96%   BMI 32.87 kg/m      07/28/2023    8:12 AM 07/14/2023    2:11 PM 07/14/2023   10:13 AM  Vitals with BMI  Height 6\' 2"  6\' 2"  6\' 2"   Weight 256 lbs 254 lbs 6 oz 258 lbs  BMI 32.85 32.65 33.11  Systolic 125 126 409  Diastolic 70 62 67  Pulse 81 92      Physical Exam Vitals and nursing note reviewed.  Constitutional:      Appearance: Normal appearance. He is normal weight.  HENT:     Head: Normocephalic and atraumatic.     Right Ear: Ear canal and external ear normal. There is impacted cerumen.     Left Ear: Tympanic membrane, ear canal and external ear normal.     Nose: Congestion present.     Right Sinus: No maxillary sinus tenderness or frontal sinus tenderness.     Left Sinus: No maxillary sinus tenderness or frontal sinus tenderness.     Mouth/Throat:     Mouth: Mucous membranes are moist.     Pharynx: Oropharynx is clear.  Eyes:     Extraocular Movements: Extraocular movements intact.     Conjunctiva/sclera: Conjunctivae normal.     Pupils: Pupils are equal, round, and  reactive to light.  Cardiovascular:     Rate and Rhythm: Normal rate and regular rhythm.     Pulses: Normal pulses.     Heart sounds: Normal heart sounds.  Pulmonary:     Effort: Pulmonary effort is normal.     Breath sounds: Normal breath sounds.  Musculoskeletal:     Cervical back: No tenderness.  Lymphadenopathy:     Cervical: No cervical adenopathy.  Skin:    General: Skin is warm and dry.     Capillary Refill: Capillary refill takes less than 2 seconds.  Neurological:     General: No focal deficit present.     Mental Status: He is alert and oriented to person, place, and time. Mental status is at baseline.  Psychiatric:        Mood and Affect: Mood normal.        Behavior: Behavior normal.        Thought Content: Thought content normal.        Judgment:  Judgment normal.     Assessment & Plan:  Viral URI with cough Assessment & Plan: Reassured patient that symptoms and exam findings are most consistent with a viral upper respiratory infection and explained lack of efficacy of antibiotics against viruses.  Discussed expected course and features suggestive of secondary bacterial infection.  Continue supportive care. Increase fluid intake with water or electrolyte solution like pedialyte. Encouraged acetaminophen  as needed for fever/pain. Encouraged salt water gargling, chloraseptic spray and throat lozenges. Encouraged OTC guaifenesin. Encouraged saline sinus flushes and/or neti with humidified air.        Follow up plan: Return if symptoms worsen or fail to improve.  Jenelle Mis, FNP

## 2023-07-28 NOTE — Assessment & Plan Note (Signed)

## 2023-07-31 ENCOUNTER — Ambulatory Visit (INDEPENDENT_AMBULATORY_CARE_PROVIDER_SITE_OTHER): Admitting: Family Medicine

## 2023-07-31 ENCOUNTER — Encounter: Payer: Self-pay | Admitting: Family Medicine

## 2023-07-31 VITALS — BP 136/60 | HR 83 | Temp 99.5°F | Ht 74.0 in | Wt 251.4 lb

## 2023-07-31 DIAGNOSIS — K1379 Other lesions of oral mucosa: Secondary | ICD-10-CM | POA: Diagnosis not present

## 2023-07-31 DIAGNOSIS — J343 Hypertrophy of nasal turbinates: Secondary | ICD-10-CM

## 2023-07-31 DIAGNOSIS — J302 Other seasonal allergic rhinitis: Secondary | ICD-10-CM | POA: Diagnosis not present

## 2023-07-31 MED ORDER — PREDNISONE 10 MG (21) PO TBPK
ORAL_TABLET | ORAL | 0 refills | Status: AC
Start: 2023-07-31 — End: ?

## 2023-07-31 NOTE — Progress Notes (Signed)
 Patient Office Visit  Assessment & Plan:  Nasal turbinate hypertrophy -     predniSONE ; Use as directed.  Dispense: 21 each; Refill: 0 -     Ambulatory referral to ENT  Uvular edema -     predniSONE ; Use as directed.  Dispense: 21 each; Refill: 0  Seasonal allergies   Prednisone  taper, continue nasal steroid. ENT consult ordered Return if symptoms worsen or fail to improve.   Subjective:    Patient ID: Jose Owen, male    DOB: 02/23/1941  Age: 83 y.o. MRN: [id number]  Chief Complaint  Patient presents with   Nose complaint    Pt thinks that he has something stuck in his R nostril. He states that he was in [place] last week and after he left his son was dx with covid. He was seen on Tuesday and was told it was to late to test.    HPI Allergic Rhinitis: feels like something is stuck right nostril. No trauma but cannot breathe through nose right side. Left side is Ok .Pt having  allergic rhinitis symptoms but getting worse.  Patient's symptoms include cough, headaches, nasal congestion, postnasal drip, sinus pressure and sneezing.Current triggers include exposure to pollens. The patient has been suffering from these symptoms for approximately few weeks but right nostril has been blocked for years.  Using nasal steroid but still feels like the right side if blocked. Since last Thursday felt symptoms coming on, worsening this past Saturday. Had chills but no fevers, very fatigued. Got flu shot shot this season.  No wheezing. Former smoker. Pt uses CPAP at night time but could not use it last night.   The ASCVD Risk score (Arnett DK, et al., 2019) failed to calculate for the following reasons:   The 2019 ASCVD risk score is only valid for ages 46 to 33  Past Medical History:  Diagnosis Date   BPH (benign prostatic hyperplasia)    Past Surgical History:  Procedure Laterality Date   SKIN CANCER EXCISION  10/2020   right hand   Social History   Tobacco Use   Smoking status: Former     Current packs/day: 0.00    Average packs/day: 0.5 packs/day for 9.0 years (4.5 ttl pk-yrs)    Types: Cigarettes    Start date: 03/31/1962    Quit date: 04/01/1971    Years since quitting: 52.3   Smokeless tobacco: Never  Vaping Use   Vaping status: Never Used  Substance Use Topics   Alcohol use: No   Drug use: No   History reviewed. No pertinent family history. No Known Allergies  ROS    Objective:    BP 136/60   Pulse 83   Temp 99.5 F (37.5 C)   Ht 6\' 2"  (1.88 m)   Wt 251 lb 6 oz (114 kg)   SpO2 97%   BMI 32.27 kg/m  BP Readings from Last 3 Encounters:  07/31/23 136/60  07/28/23 125/70  07/14/23 126/62   Wt Readings from Last 3 Encounters:  07/31/23 251 lb 6 oz (114 kg)  07/28/23 256 lb (116.1 kg)  07/14/23 254 lb 6.4 oz (115.4 kg)    Physical Exam Vitals and nursing note reviewed.  Constitutional:      General: He is not in acute distress.    Appearance: Normal appearance.  HENT:     Head: Normocephalic.     Right Ear: Tympanic membrane, ear canal and external ear normal.     Left Ear: Tympanic  membrane, ear canal and external ear normal.     Nose: Congestion present.     Right Nostril: No foreign body.     Right Turbinates: Enlarged.     Left Turbinates: Not swollen.     Right Sinus: No frontal sinus tenderness.     Left Sinus: No frontal sinus tenderness.     Mouth/Throat:     Mouth: Mucous membranes are moist.     Pharynx: No oropharyngeal exudate or posterior oropharyngeal erythema.     Comments: Uvula edematous and large.  Eyes:     Extraocular Movements: Extraocular movements intact.     Pupils: Pupils are equal, round, and reactive to light.  Cardiovascular:     Rate and Rhythm: Normal rate and regular rhythm.     Heart sounds: Normal heart sounds.  Pulmonary:     Effort: Pulmonary effort is normal.     Breath sounds: Normal breath sounds. No wheezing.  Neurological:     General: No focal deficit present.     Mental Status: He is alert  and oriented to person, place, and time.  Psychiatric:        Mood and Affect: Mood normal.      No results found for any visits on 07/31/23.

## 2023-08-04 ENCOUNTER — Encounter: Payer: Self-pay | Admitting: Family Medicine

## 2023-08-04 ENCOUNTER — Ambulatory Visit: Payer: Self-pay

## 2023-08-04 ENCOUNTER — Ambulatory Visit (INDEPENDENT_AMBULATORY_CARE_PROVIDER_SITE_OTHER): Admitting: Family Medicine

## 2023-08-04 VITALS — BP 120/62 | HR 90 | Temp 97.8°F | Ht 74.0 in | Wt 254.0 lb

## 2023-08-04 DIAGNOSIS — J339 Nasal polyp, unspecified: Secondary | ICD-10-CM | POA: Diagnosis not present

## 2023-08-04 NOTE — Progress Notes (Signed)
 Subjective:    Patient ID: Jose Owen, male    DOB: 1940/11/20, 83 y.o.   MRN: [id number]  Patient has been on Flonase for the last 2 months for nasal congestion.  However over the last 2 weeks it is suddenly worsened.  He was started on an antibiotic by another provider.  He recently saw my partner and was started on prednisone .  On examination today, his right nostril is completely occluded by a clear pearly-colored mass.  This appears to be a nasal polyp.  It is completely obstructing the nasal passage. Past Medical History:  Diagnosis Date   BPH (benign prostatic hyperplasia)    Past Surgical History:  Procedure Laterality Date   SKIN CANCER EXCISION  10/2020   right hand   Current Outpatient Medications on File Prior to Visit  Medication Sig Dispense Refill   acetaminophen  (TYLENOL ) 500 MG tablet Take 500 mg by mouth as needed.     HYDROcodone  bit-homatropine (HYCODAN) 5-1.5 MG/5ML syrup Take 5 mLs by mouth every 8 (eight) hours as needed for cough. 120 mL 0   meclizine  (ANTIVERT ) 25 MG tablet Take 1 tablet (25 mg total) by mouth 3 (three) times daily as needed for dizziness. (Patient not taking: Reported on 07/14/2023) 30 tablet 1   meloxicam  (MOBIC ) 7.5 MG tablet TAKE 1 TABLET BY MOUTH EVERY DAY AS NEEDED FOR PAIN 30 tablet 0   predniSONE  (STERAPRED UNI-PAK 21 TAB) 10 MG (21) TBPK tablet Use as directed. 21 each 0   tamsulosin  (FLOMAX ) 0.4 MG CAPS capsule Take 1 capsule (0.4 mg total) by mouth daily. 90 capsule 0   vitamin B-12 (CYANOCOBALAMIN ) 1000 MCG tablet Take 1 tablet (1,000 mcg total) by mouth daily. (Patient not taking: Reported on 07/14/2023)     No current facility-administered medications on file prior to visit.   No Known Allergies Social History   Socioeconomic History   Marital status: Widowed    Spouse name: Not on file   Number of children: 2   Years of education: Not on file   Highest education level: Not on file  Occupational History   Not on file   Tobacco Use   Smoking status: Former    Current packs/day: 0.00    Average packs/day: 0.5 packs/day for 9.0 years (4.5 ttl pk-yrs)    Types: Cigarettes    Start date: 03/31/1962    Quit date: 04/01/1971    Years since quitting: 52.3   Smokeless tobacco: Never  Vaping Use   Vaping status: Never Used  Substance and Sexual Activity   Alcohol use: No   Drug use: No   Sexual activity: Not Currently  Other Topics Concern   Not on file  Social History Narrative   Widowed 03/2018.   Social Drivers of Corporate investment banker Strain: Low Risk  (07/14/2023)   Overall Financial Resource Strain (CARDIA)    Difficulty of Paying Living Expenses: Not hard at all  Food Insecurity: No Food Insecurity (07/14/2023)   Hunger Vital Sign    Worried About Running Out of Food in the Last Year: Never true    Ran Out of Food in the Last Year: Never true  Transportation Needs: No Transportation Needs (07/14/2023)   PRAPARE - Administrator, Civil Service (Medical): No    Lack of Transportation (Non-Medical): No  Physical Activity: Sufficiently Active (07/14/2023)   Exercise Vital Sign    Days of Exercise per Week: 5 days    Minutes of  Exercise per Session: 30 min  Stress: No Stress Concern Present (07/14/2023)   [employer] of Occupational Health - Occupational Stress Questionnaire    Feeling of Stress : Not at all  Social Connections: Moderately Integrated (07/14/2023)   Social Connection and Isolation Panel [NHANES]    Frequency of Communication with Friends and Family: More than three times a week    Frequency of Social Gatherings with Friends and Family: Twice a week    Attends Religious Services: More than 4 times per year    Active Member of [employer] or Organizations: Yes    Attends Banker Meetings: More than 4 times per year    Marital Status: Widowed  Intimate Partner Violence: Not At Risk (07/14/2023)   Humiliation, Afraid, Rape, and Kick questionnaire    Fear of  Current or Ex-Partner: No    Emotionally Abused: No    Physically Abused: No    Sexually Abused: No   No family history on file. Father has a history of atrial fibrillation and prostate cancer.  Mother died due to old age.  He does have a family history of familial lipomatosis  Review of Systems  All other systems reviewed and are negative.      Objective:   Physical Exam Vitals reviewed.  Constitutional:      General: He is not in acute distress.    Appearance: Normal appearance. He is obese. He is not ill-appearing, toxic-appearing or diaphoretic.  HENT:     Head: Normocephalic and atraumatic.     Nose:     Right Nostril: Occlusion present.     Comments: Right nostril appears to be occluded by a large polyp    Mouth/Throat:     Mouth: Mucous membranes are moist.     Pharynx: No pharyngeal swelling or oropharyngeal exudate.  Eyes:     General: No scleral icterus.       Right eye: No discharge.        Left eye: No discharge.     Extraocular Movements: Extraocular movements intact.     Conjunctiva/sclera: Conjunctivae normal.     Pupils: Pupils are equal, round, and reactive to light.  Neck:     Vascular: No carotid bruit.  Cardiovascular:     Rate and Rhythm: Normal rate and regular rhythm.     Pulses: Normal pulses.     Heart sounds: Normal heart sounds. No murmur heard.    No friction rub. No gallop.  Pulmonary:     Effort: Pulmonary effort is normal. No respiratory distress.     Breath sounds: Normal breath sounds. No stridor. No wheezing, rhonchi or rales.  Chest:     Chest wall: No tenderness.  Abdominal:     General: Abdomen is flat. Bowel sounds are normal. There is no distension.     Palpations: Abdomen is soft. There is no mass.     Tenderness: There is no abdominal tenderness. There is no right CVA tenderness, left CVA tenderness, guarding or rebound.     Hernia: No hernia is present.  Musculoskeletal:        General: No swelling, tenderness, deformity or  signs of injury.     Cervical back: Normal range of motion and neck supple. No rigidity or tenderness.     Right lower leg: No edema.     Left lower leg: No edema.  Lymphadenopathy:     Cervical: No cervical adenopathy.  Skin:    General: Skin is warm.  Capillary Refill: Capillary refill takes less than 2 seconds.     Coloration: Skin is not jaundiced or pale.     Findings: No bruising, erythema, lesion or rash.  Neurological:     General: No focal deficit present.     Mental Status: He is alert and oriented to person, place, and time. Mental status is at baseline.     Cranial Nerves: No cranial nerve deficit.     Sensory: No sensory deficit.     Motor: No weakness.     Coordination: Coordination normal.     Gait: Gait normal.     Deep Tendon Reflexes: Reflexes normal.  Psychiatric:        Mood and Affect: Mood normal.        Behavior: Behavior normal.        Thought Content: Thought content normal.        Judgment: Judgment normal.           Assessment & Plan:  Nasal polyp - Plan: Ambulatory referral to ENT Patient appears to have obstruction of his nasal passage by a large polyp.  I will refer to ENT to discuss surgical extraction/removal.  Meanwhile try Afrin 2 sprays in the right nostril twice daily.  30 minutes after the Afrin, the patient can add Flonase 2 sprays in the right nostril twice daily.  Hopefully this will improve the symptoms until he can see ENT

## 2023-08-04 NOTE — Telephone Encounter (Signed)
 Chief Complaint: Nasal congestion Symptoms: Nasal congestion, difficulty breathing Frequency: ongoing since previous office visit Pertinent Negatives: Patient denies fever, earache Disposition: [] ED /[] Urgent Care (no appt availability in office) / [] Appointment(In office/virtual)/ []  [facility]/ [] Home Care/ [] Refused Recommended Disposition /[]  Mobile Bus/ [x]  Follow-up with PCP Additional Notes: Patient called in stating he is still having difficulty breathing due to his nasal congestion. Patient was seen twice in office recently and stated he was supposed to have a referral sent to ENT. Patient contacted ENT and they told him they did not receive the referral and to have his PCP resend it asap. This RN also noted in previous office visit notes for patient to return if symptoms do not improve. Patient states this morning he is experiencing worsening sinus pain in the face, and nasal congestion on one side. Patient states he will come back in if advised. Please contact patient if advised to revisit for appt, and advise on ENT referral.    Copied from CRM [id number]. Topic: Clinical - Red Word Triage >> Weilbacher 6, 2025  8:18 AM Juluis Ok wrote: [facility] that prompted transfer to Nurse Triage: difficulty breathing Reason for Disposition  [1] Sinus congestion as part of a cold AND [2] present < 10 days  Answer Assessment - Initial Assessment Questions 1. LOCATION: "Where does it hurt?"      Pain under eyes with sinus pressure 2. ONSET: "When did the sinus pain start?"  (e.g., hours, days)      Worsened today 3. SEVERITY: "How bad is the pain?"   (Scale 1-10; mild, moderate or severe)   - MILD (1-3): doesn't interfere with normal activities    - MODERATE (4-7): interferes with normal activities (e.g., work or school) or awakens from sleep   - SEVERE (8-10): excruciating pain and patient unable to do any normal activities        5 4. RECURRENT SYMPTOM: "Have you ever had sinus  problems before?" If Yes, ask: "When was the last time?" and "What happened that time?"      N/a 5. NASAL CONGESTION: "Is the nose blocked?" If Yes, ask: "Can you open it or must you breathe through your mouth?"     Yes - patient seen in office for this on Friday 6. NASAL DISCHARGE: "Do you have discharge from your nose?" If so ask, "What color?"     Clear  7. FEVER: "Do you have a fever?" If Yes, ask: "What is it, how was it measured, and when did it start?"      No 8. OTHER SYMPTOMS: "Do you have any other symptoms?" (e.g., sore throat, cough, earache, difficulty breathing)     Mild cough  Protocols used: Sinus Pain or Congestion-A-AH

## 2023-08-04 NOTE — Telephone Encounter (Signed)
 Spoke with patient. Scheduled appointment this morning in office with PCP

## 2023-08-06 ENCOUNTER — Telehealth: Payer: Self-pay | Admitting: Family Medicine

## 2023-08-06 NOTE — Telephone Encounter (Signed)
 Copied from CRM [id number]. Topic: Referral - Question >> Sakamoto 8, 2025  2:49 PM Jose Owen S wrote: Reason for CRM: Pt wants Dr Cheril Cork to cb regarding seeing another ENT specialist because the right side is completely closed. [phone]

## 2023-08-07 ENCOUNTER — Telehealth: Payer: Self-pay

## 2023-08-07 NOTE — Telephone Encounter (Signed)
 Copied from CRM [id number]. Topic: Referral - Question >> MHi ma'am, Pt called and states Dr. Virgia Griffins is unable to see him until July. Pt has a nasal polyp that is completely obstructing his nasal passage. Dr. Cheril Cork asks if we can refer him to [facility] Grills or Dr Darlin Ehrlich? Thank you!   5/8, 2025  2:49 PM Vonetta S wrote: Reason for CRM: Pt wants Dr Cheril Cork to cb regarding seeing another ENT specialist because the right side is completely closed. [phone] >> [place]hn 9, 2025  9:27 AM Lizabeth Riggs wrote: Laterrian is checking to see if Kevon Pellegrini (Dr. Monty App nurse)  has found out anything about the referral to ENT. Please call him back at [phone]. Thanks

## 2023-08-13 ENCOUNTER — Telehealth: Payer: Self-pay

## 2023-08-13 NOTE — Telephone Encounter (Signed)
 Copied from CRM [id number]. Topic: Referral - Status >> Bonfiglio 15, 2025 11:07 AM Star East wrote: Reason for CRM: Still waiting to  hear about a sooner appt for ENT- have not heard anything- please call to advise [phone]

## 2023-09-03 ENCOUNTER — Telehealth: Payer: Self-pay

## 2023-09-03 NOTE — Telephone Encounter (Signed)
 Pt. Has been notified  of physician's response. He stated he understood.

## 2023-09-03 NOTE — Telephone Encounter (Signed)
 Copied from CRM [id number]. Topic: Clinical - Medication Question >> Sep 03, 2023  8:47 AM Lizabeth Riggs wrote: Reason for CRM:  Jose Owen wants to know if he can take Afrin for another week because his nose is stopped up.  He has been off of Afrin for about 1 month. Please call Hollie at [phone].

## 2023-09-03 NOTE — Telephone Encounter (Signed)
 Copied from CRM [id number]. Topic: Clinical - Medication Question >> Sep 03, 2023  8:40 AM Ivette P wrote: Reason for CRM: pt called in about their nasal spray and was told to take his pill medication and then take the nasal spray immediately and was told to do this for a week and then stop. Pt wants to know if he can do it again, pt has enough medication but wants to know if he can take it again.   Please follow up with pt   Pt callback [phone]

## 2023-10-06 ENCOUNTER — Institutional Professional Consult (permissible substitution) (INDEPENDENT_AMBULATORY_CARE_PROVIDER_SITE_OTHER): Admitting: Otolaryngology

## 2023-10-06 ENCOUNTER — Ambulatory Visit (INDEPENDENT_AMBULATORY_CARE_PROVIDER_SITE_OTHER): Admitting: Family Medicine

## 2023-10-06 ENCOUNTER — Encounter: Payer: Self-pay | Admitting: Family Medicine

## 2023-10-06 VITALS — BP 118/66 | HR 69 | Ht 74.0 in | Wt 252.4 lb

## 2023-10-06 DIAGNOSIS — M25511 Pain in right shoulder: Secondary | ICD-10-CM

## 2023-10-06 DIAGNOSIS — G8929 Other chronic pain: Secondary | ICD-10-CM | POA: Diagnosis not present

## 2023-10-06 MED ORDER — TRIAMCINOLONE ACETONIDE 40 MG/ML IJ SUSP
80.0000 mg | Freq: Once | INTRAMUSCULAR | Status: AC
Start: 2023-10-06 — End: 2023-10-06
  Administered 2023-10-06: 80 mg via INTRA_ARTICULAR

## 2023-10-06 NOTE — Addendum Note (Signed)
 Addended by: ANGELENA RONAL BRADLEY K on: 10/06/2023 01:04 PM   Modules accepted: Orders

## 2023-10-06 NOTE — Progress Notes (Signed)
 Subjective:    Patient ID: Jose Owen, male    DOB: 04/13/1940, 83 y.o.   MRN: [id number]  Patient reports several years of right shoulder pain.  He is unable to abduct his arm greater than 90 degrees.  He has pain with empty can maneuver.  He has pain with Hawkins maneuver.  He reports pain with overhead activities.  He is requesting another cortisone injection.  He last had the injection in September 2024.  This helped.  However for the last few months the pain has returned. Past Medical History:  Diagnosis Date   BPH (benign prostatic hyperplasia)    Past Surgical History:  Procedure Laterality Date   SKIN CANCER EXCISION  10/2020   right hand   Current Outpatient Medications on File Prior to Visit  Medication Sig Dispense Refill   acetaminophen  (TYLENOL ) 500 MG tablet Take 500 mg by mouth as needed.     tamsulosin  (FLOMAX ) 0.4 MG CAPS capsule Take 1 capsule (0.4 mg total) by mouth daily. 90 capsule 0   vitamin B-12 (CYANOCOBALAMIN ) 1000 MCG tablet Take 1 tablet (1,000 mcg total) by mouth daily.     HYDROcodone  bit-homatropine (HYCODAN) 5-1.5 MG/5ML syrup Take 5 mLs by mouth every 8 (eight) hours as needed for cough. (Patient not taking: Reported on 10/06/2023) 120 mL 0   meclizine  (ANTIVERT ) 25 MG tablet Take 1 tablet (25 mg total) by mouth 3 (three) times daily as needed for dizziness. (Patient not taking: Reported on 10/06/2023) 30 tablet 1   meloxicam  (MOBIC ) 7.5 MG tablet TAKE 1 TABLET BY MOUTH EVERY DAY AS NEEDED FOR PAIN (Patient not taking: Reported on 10/06/2023) 30 tablet 0   predniSONE  (STERAPRED UNI-PAK 21 TAB) 10 MG (21) TBPK tablet Use as directed. (Patient not taking: Reported on 10/06/2023) 21 each 0   No current facility-administered medications on file prior to visit.   No Known Allergies Social History   Socioeconomic History   Marital status: Widowed    Spouse name: Not on file   Number of children: 2   Years of education: Not on file   Highest education level:  Never attended school  Occupational History   Not on file  Tobacco Use   Smoking status: Former    Current packs/day: 0.00    Average packs/day: 0.5 packs/day for 9.0 years (4.5 ttl pk-yrs)    Types: Cigarettes    Start date: 03/31/1962    Quit date: 04/01/1971    Years since quitting: 52.5   Smokeless tobacco: Never  Vaping Use   Vaping status: Never Used  Substance and Sexual Activity   Alcohol use: No   Drug use: No   Sexual activity: Not Currently  Other Topics Concern   Not on file  Social History Narrative   Widowed 03/2018.   Social Drivers of Corporate investment banker Strain: Low Risk  (10/05/2023)   Overall Financial Resource Strain (CARDIA)    Difficulty of Paying Living Expenses: Not hard at all  Food Insecurity: No Food Insecurity (10/05/2023)   Hunger Vital Sign    Worried About Running Out of Food in the Last Year: Never true    Ran Out of Food in the Last Year: Never true  Transportation Needs: No Transportation Needs (10/05/2023)   PRAPARE - Administrator, Civil Service (Medical): No    Lack of Transportation (Non-Medical): No  Physical Activity: Inactive (10/05/2023)   Exercise Vital Sign    Days of Exercise per Week:  0 days    Minutes of Exercise per Session: Not on file  Stress: No Stress Concern Present (10/05/2023)   [employer] of Occupational Health - Occupational Stress Questionnaire    Feeling of Stress: Not at all  Social Connections: Moderately Integrated (10/05/2023)   Social Connection and Isolation Panel    Frequency of Communication with Friends and Family: More than three times a week    Frequency of Social Gatherings with Friends and Family: Twice a week    Attends Religious Services: More than 4 times per year    Active Member of [employer] or Organizations: Yes    Attends Banker Meetings: More than 4 times per year    Marital Status: Widowed  Intimate Partner Violence: Not At Risk (07/14/2023)   Humiliation, Afraid,  Rape, and Kick questionnaire    Fear of Current or Ex-Partner: No    Emotionally Abused: No    Physically Abused: No    Sexually Abused: No   No family history on file. Father has a history of atrial fibrillation and prostate cancer.  Mother died due to old age.  He does have a family history of familial lipomatosis  Review of Systems  All other systems reviewed and are negative.      Objective:   Physical Exam Vitals reviewed.  Constitutional:      General: He is not in acute distress.    Appearance: Normal appearance. He is obese. He is not ill-appearing, toxic-appearing or diaphoretic.  HENT:     Head: Normocephalic and atraumatic.  Cardiovascular:     Rate and Rhythm: Normal rate and regular rhythm.     Pulses: Normal pulses.     Heart sounds: Normal heart sounds. No murmur heard.    No friction rub. No gallop.  Pulmonary:     Effort: Pulmonary effort is normal. No respiratory distress.     Breath sounds: Normal breath sounds. No stridor. No wheezing, rhonchi or rales.  Chest:     Chest wall: No tenderness.  Musculoskeletal:     Right shoulder: Bony tenderness present. Decreased range of motion. Decreased strength.     Cervical back: No tenderness.  Neurological:     General: No focal deficit present.     Mental Status: He is alert and oriented to person, place, and time. Mental status is at baseline.           Assessment & Plan:   Chronic right shoulder pain  I believe this is due to a combination of glenohumeral arthritis and likely subacromial bursitis.  Using sterile technique, I injected the right subacromial space with 2 cc lidocaine , 2 cc of Marcaine , and 2 cc of 40 mg/mL Kenalog .  The patient tolerated the procedure well without complication.

## 2023-11-05 DIAGNOSIS — H5201 Hypermetropia, right eye: Secondary | ICD-10-CM | POA: Diagnosis not present

## 2023-11-26 ENCOUNTER — Other Ambulatory Visit: Payer: Self-pay | Admitting: Family Medicine

## 2024-02-22 ENCOUNTER — Telehealth: Payer: Self-pay

## 2024-02-22 ENCOUNTER — Other Ambulatory Visit: Payer: Self-pay

## 2024-02-22 MED ORDER — TAMSULOSIN HCL 0.4 MG PO CAPS: 0.4000 mg | ORAL_CAPSULE | Freq: Every day | ORAL | 0 refills | Status: AC

## 2024-02-22 NOTE — Telephone Encounter (Signed)
 Prescription Request  02/22/2024  LOV: 10/06/23  What is the name of the medication or equipment? tamsulosin  (FLOMAX ) 0.4 MG CAPS capsule [[id number]]   Have you contacted your pharmacy to request a refill? Yes   Which pharmacy would you like this sent to?  [id number] [place], [place] - [address] [place]  [address] Phone: [phone] Fax: [phone]    Patient notified that their request is being sent to the clinical staff for review and that they should receive a response within 2 business days.   Please advise at [facility] [phone]

## 2024-02-22 NOTE — Telephone Encounter (Signed)
 Sent in medication

## 2024-03-08 ENCOUNTER — Ambulatory Visit: Admitting: Family Medicine

## 2024-03-08 ENCOUNTER — Encounter: Payer: Self-pay | Admitting: Family Medicine

## 2024-03-08 VITALS — BP 130/78 | HR 85 | Temp 97.6°F | Ht 74.0 in | Wt 247.2 lb

## 2024-03-08 DIAGNOSIS — M7541 Impingement syndrome of right shoulder: Secondary | ICD-10-CM

## 2024-03-08 NOTE — Progress Notes (Signed)
 Subjective:    Patient ID: Jose Owen, male    DOB: [birth year], 83 y.o.   MRN: [id number]  Patient reports several years of right shoulder pain.  He is unable to abduct his arm greater than 90 degrees.  He has pain with empty can maneuver.  He has pain with Hawkins maneuver.  He reports pain with overhead activities.  He is requesting another cortisone injection.  He last had the injection in 09/2023.  This helped.  However for the last few months the pain has returned. Past Medical History:  Diagnosis Date   BPH (benign prostatic hyperplasia)    Past Surgical History:  Procedure Laterality Date   SKIN CANCER EXCISION  10/2020   right hand   Current Outpatient Medications on File Prior to Visit  Medication Sig Dispense Refill   acetaminophen  (TYLENOL ) 500 MG tablet Take 500 mg by mouth as needed.     meclizine  (ANTIVERT ) 25 MG tablet Take 1 tablet (25 mg total) by mouth 3 (three) times daily as needed for dizziness. 30 tablet 1   tamsulosin  (FLOMAX ) 0.4 MG CAPS capsule Take 1 capsule (0.4 mg total) by mouth daily. 90 capsule 0   HYDROcodone  bit-homatropine (HYCODAN) 5-1.5 MG/5ML syrup Take 5 mLs by mouth every 8 (eight) hours as needed for cough. (Patient not taking: Reported on 03/08/2024) 120 mL 0   meloxicam  (MOBIC ) 7.5 MG tablet TAKE 1 TABLET BY MOUTH EVERY DAY AS NEEDED FOR PAIN (Patient not taking: Reported on 03/08/2024) 30 tablet 0   predniSONE  (STERAPRED UNI-PAK 21 TAB) 10 MG (21) TBPK tablet Use as directed. (Patient not taking: Reported on 03/08/2024) 21 each 0   vitamin B-12 (CYANOCOBALAMIN ) 1000 MCG tablet Take 1 tablet (1,000 mcg total) by mouth daily. (Patient not taking: Reported on 03/08/2024)     No current facility-administered medications on file prior to visit.   No Known Allergies Social History   Socioeconomic History   Marital status: Widowed    Spouse name: Not on file   Number of children: 2   Years of education: Not on file   Highest education level: Never  attended school  Occupational History   Not on file  Tobacco Use   Smoking status: Former    Current packs/day: 0.00    Average packs/day: 0.5 packs/day for 9.0 years (4.5 ttl pk-yrs)    Types: Cigarettes    Start date: 03/31/1962    Quit date: 04/01/1971    Years since quitting: 52.9   Smokeless tobacco: Never  Vaping Use   Vaping status: Never Used  Substance and Sexual Activity   Alcohol use: No   Drug use: No   Sexual activity: Not Currently  Other Topics Concern   Not on file  Social History Narrative   Widowed 03/2018.   Social Drivers of Corporate Investment Banker Strain: Low Risk  (10/05/2023)   Overall Financial Resource Strain (CARDIA)    Difficulty of Paying Living Expenses: Not hard at all  Food Insecurity: No Food Insecurity (10/05/2023)   Hunger Vital Sign    Worried About Running Out of Food in the Last Year: Never true    Ran Out of Food in the Last Year: Never true  Transportation Needs: No Transportation Needs (10/05/2023)   PRAPARE - Administrator, Civil Service (Medical): No    Lack of Transportation (Non-Medical): No  Physical Activity: Inactive (10/05/2023)   Exercise Vital Sign    Days of Exercise per Week: 0  days    Minutes of Exercise per Session: Not on file  Stress: No Stress Concern Present (10/05/2023)   [employer] of Occupational Health - Occupational Stress Questionnaire    Feeling of Stress: Not at all  Social Connections: Moderately Integrated (10/05/2023)   Social Connection and Isolation Panel    Frequency of Communication with Friends and Family: More than three times a week    Frequency of Social Gatherings with Friends and Family: Twice a week    Attends Religious Services: More than 4 times per year    Active Member of [employer] or Organizations: Yes    Attends Banker Meetings: More than 4 times per year    Marital Status: Widowed  Intimate Partner Violence: Not At Risk (07/14/2023)   Humiliation, Afraid, Rape, and  Kick questionnaire    Fear of Current or Ex-Partner: No    Emotionally Abused: No    Physically Abused: No    Sexually Abused: No   History reviewed. No pertinent family history. Father has a history of atrial fibrillation and prostate cancer.  Mother died due to old age.  He does have a family history of familial lipomatosis  Review of Systems  All other systems reviewed and are negative.      Objective:   Physical Exam Vitals reviewed.  Constitutional:      General: He is not in acute distress.    Appearance: Normal appearance. He is obese. He is not ill-appearing, toxic-appearing or diaphoretic.  HENT:     Head: Normocephalic and atraumatic.  Cardiovascular:     Rate and Rhythm: Normal rate and regular rhythm.     Pulses: Normal pulses.     Heart sounds: Normal heart sounds. No murmur heard.    No friction rub. No gallop.  Pulmonary:     Effort: Pulmonary effort is normal. No respiratory distress.     Breath sounds: Normal breath sounds. No stridor. No wheezing, rhonchi or rales.  Chest:     Chest wall: No tenderness.  Musculoskeletal:     Right shoulder: Bony tenderness present. Decreased range of motion. Decreased strength.     Cervical back: No tenderness.  Neurological:     General: No focal deficit present.     Mental Status: He is alert and oriented to person, place, and time. Mental status is at baseline.           Assessment & Plan:   Impingement syndrome of right shoulder   Using sterile technique, I injected the right subacromial space with 2 cc lidocaine , 2 cc of Marcaine , and 2 cc of 40 mg/mL Kenalog .  The patient tolerated the procedure well without complication.

## 2024-03-10 DIAGNOSIS — M7541 Impingement syndrome of right shoulder: Secondary | ICD-10-CM

## 2024-03-10 MED ORDER — TRIAMCINOLONE ACETONIDE 40 MG/ML IJ SUSP
80.0000 mg | Freq: Once | INTRAMUSCULAR | Status: AC
  Administered 2024-03-10: 80 mg via INTRA_ARTICULAR

## 2024-03-10 NOTE — Addendum Note (Signed)
 Addended by: ANGELENA RONAL BRADLEY K on: 03/10/2024 09:35 AM   Modules accepted: Orders

## 2024-06-06 ENCOUNTER — Ambulatory Visit: Admitting: Family Medicine
